# Patient Record
Sex: Male | Born: 1960
Health system: Southern US, Community
[De-identification: ages and names within clinical notes are randomized; demographics above are authoritative.]

## PROBLEM LIST (undated history)

## (undated) DIAGNOSIS — I480 Paroxysmal atrial fibrillation: Secondary | ICD-10-CM

## (undated) DIAGNOSIS — R7303 Prediabetes: Secondary | ICD-10-CM

## (undated) DIAGNOSIS — R072 Precordial pain: Secondary | ICD-10-CM

## (undated) DIAGNOSIS — L02416 Cutaneous abscess of left lower limb: Secondary | ICD-10-CM

## (undated) DIAGNOSIS — R931 Abnormal findings on diagnostic imaging of heart and coronary circulation: Secondary | ICD-10-CM

## (undated) DIAGNOSIS — Z87891 Personal history of nicotine dependence: Secondary | ICD-10-CM

## (undated) HISTORY — DX: Paroxysmal atrial fibrillation: I48.0

## (undated) HISTORY — DX: Personal history of nicotine dependence: Z87.891

## (undated) HISTORY — DX: Prediabetes: R73.03

## (undated) HISTORY — PX: MULTIPLE TOOTH EXTRACTIONS: SHX2053

## (undated) HISTORY — DX: Precordial pain: R07.2

## (undated) HISTORY — DX: Abnormal findings on diagnostic imaging of heart and coronary circulation: R93.1

## (undated) HISTORY — DX: Morbid (severe) obesity due to excess calories: E66.01

---
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
## 1999-01-10 ENCOUNTER — Emergency Department (HOSPITAL_COMMUNITY): Admission: EM | Admit: 1999-01-10 | Discharge: 1999-01-10 | Payer: Self-pay | Admitting: Emergency Medicine

## 1999-01-16 ENCOUNTER — Emergency Department (HOSPITAL_COMMUNITY): Admission: EM | Admit: 1999-01-16 | Discharge: 1999-01-16 | Payer: Self-pay | Admitting: Emergency Medicine

## 2002-03-06 ENCOUNTER — Emergency Department (HOSPITAL_COMMUNITY): Admission: EM | Admit: 2002-03-06 | Discharge: 2002-03-06 | Payer: Self-pay | Admitting: Emergency Medicine

## 2002-03-06 ENCOUNTER — Encounter: Payer: Self-pay | Admitting: Emergency Medicine

## 2003-08-08 LAB — COMPREHENSIVE METABOLIC PANEL
ALT: 18 U/L (ref 10–40)
AST: 20 U/L
Creat: 1.1
Total Bilirubin: 0.9 mg/dL

## 2003-08-08 LAB — CBC: Hemoglobin: 14.6 g/dL (ref 13.5–17.5)

## 2003-08-08 LAB — LIPID PANEL: Cholesterol, Total: 175

## 2006-12-05 HISTORY — PX: OTHER SURGICAL HISTORY: SHX169

## 2006-12-18 ENCOUNTER — Emergency Department (HOSPITAL_COMMUNITY): Admission: EM | Admit: 2006-12-18 | Discharge: 2006-12-18 | Payer: Self-pay | Admitting: Family Medicine

## 2009-05-05 ENCOUNTER — Ambulatory Visit: Payer: Self-pay | Admitting: Internal Medicine

## 2010-11-05 ENCOUNTER — Ambulatory Visit: Payer: Self-pay | Admitting: Internal Medicine

## 2011-01-05 HISTORY — PX: DOBUTAMINE STRESS ECHO: SHX5426

## 2011-05-26 ENCOUNTER — Ambulatory Visit: Payer: Self-pay | Admitting: Family Medicine

## 2011-06-06 ENCOUNTER — Encounter: Payer: Self-pay | Admitting: Family Medicine

## 2011-06-06 ENCOUNTER — Ambulatory Visit (INDEPENDENT_AMBULATORY_CARE_PROVIDER_SITE_OTHER): Payer: 59 | Admitting: Family Medicine

## 2011-06-06 VITALS — BP 142/82 | HR 68 | Temp 98.1°F | Ht 73.0 in | Wt 288.0 lb

## 2011-06-06 DIAGNOSIS — Z6841 Body Mass Index (BMI) 40.0 and over, adult: Secondary | ICD-10-CM | POA: Insufficient documentation

## 2011-06-06 DIAGNOSIS — E669 Obesity, unspecified: Secondary | ICD-10-CM

## 2011-06-06 DIAGNOSIS — Z0000 Encounter for general adult medical examination without abnormal findings: Secondary | ICD-10-CM | POA: Insufficient documentation

## 2011-06-06 DIAGNOSIS — Z720 Tobacco use: Secondary | ICD-10-CM | POA: Insufficient documentation

## 2011-06-06 DIAGNOSIS — F17200 Nicotine dependence, unspecified, uncomplicated: Secondary | ICD-10-CM

## 2011-06-06 NOTE — Progress Notes (Signed)
Subjective:    Patient ID: Eric Salazar, male    DOB: [birth year], 50 y.o.   MRN: [id number]  HPI CC: new patient, establish  Would like to establish here as more convenient as lives in [place].  Previously saw Dr. Eden Emms Baxley in [place], overall routine care.   Has had back spasms in past, back "going out".  Has had L knee pain in past.  No questions or concerns today.  Smoker - trying to quit again.  Quit cold [place] 2 mo ago, restarted about 2 wks ago.  Has never tried chantix.  Has tried e-cigarettes, helped some.  A few months ago had chest pain, SOB.  Saw cards at [facility], stress test looking normal.  Told could do catheterization, thinking about it.  Preventative: Last CPE 93yrs ago with blood work. Last tetanus shot, unsure, thinks received at previous PCP's.  Medications and allergies reviewed and updated in chart.  There is no problem list on file for this patient.  Past Medical History  Diagnosis Date  . Smoker    No past surgical history on file. History  Substance Use Topics  . Smoking status: Current Everyday Smoker -- 0.5 packs/day    Types: Cigarettes  . Smokeless tobacco: Never Used  . Alcohol Use: Yes     occasionally   Family History  Problem Relation Age of Onset  . Hypertension Mother   . Diabetes Mother     prediabetes  . Coronary artery disease Mother 55  . Hypertension Father   . Stroke Father 63  . Cancer Neg Hx    No Known Allergies No current outpatient prescriptions on file prior to visit.   Review of Systems  Constitutional: Negative for fever, chills, activity change, appetite change, fatigue and unexpected weight change.  HENT: Negative for hearing loss and neck pain.   Eyes: Negative for visual disturbance.  Respiratory: Negative for cough, chest tightness, shortness of breath and wheezing.   Cardiovascular: Negative for chest pain, palpitations and leg swelling.  Gastrointestinal: Negative for nausea, vomiting,  abdominal pain, diarrhea, constipation, blood in stool and abdominal distention.  Genitourinary: Negative for hematuria and difficulty urinating.  Musculoskeletal: Negative for myalgias and arthralgias.  Skin: Negative for rash.  Neurological: Negative for dizziness, seizures, syncope and headaches.  Hematological: Does not bruise/bleed easily.  Psychiatric/Behavioral: Negative for dysphoric mood. The patient is not nervous/anxious.        Objective:   Physical Exam  Nursing note and vitals reviewed. Constitutional: He is oriented to person, place, and time. He appears well-developed and well-nourished. No distress.  HENT:  Head: Normocephalic and atraumatic.  Right Ear: External ear normal.  Left Ear: External ear normal.  Nose: Nose normal.  Mouth/Throat: Oropharynx is clear and moist.  Eyes: Conjunctivae and EOM are normal. Pupils are equal, round, and reactive to light.  Neck: Normal range of motion. Neck supple. No thyromegaly present.  Cardiovascular: Normal rate, regular rhythm, normal heart sounds and intact distal pulses.   No murmur heard. Pulses:      Radial pulses are 2+ on the right side, and 2+ on the left side.  Pulmonary/Chest: Effort normal and breath sounds normal. No respiratory distress. He has no wheezes. He has no rales.       Somewhat coarse breath sounds  Abdominal: Soft. Bowel sounds are normal. He exhibits no distension and no mass. There is no tenderness. There is no rebound and no guarding.  Musculoskeletal: Normal range of motion.  Lymphadenopathy:  He has no cervical adenopathy.  Neurological: He is alert and oriented to person, place, and time.       CN grossly intact, station and gait intact  Skin: Skin is warm and dry. No rash noted.  Psychiatric: He has a normal mood and affect. His behavior is normal. Judgment and thought content normal.          Assessment & Plan:

## 2011-06-06 NOTE — Assessment & Plan Note (Addendum)
Recommend incorporate activity into routine. Body mass index is 38.00 kg/(m^2).

## 2011-06-06 NOTE — Assessment & Plan Note (Signed)
Discussed importance of cessation, encouraged to continue cutting back. Advised to let us know if wants assistance, pt prefers to try cold [place] as previously.

## 2011-06-06 NOTE — Assessment & Plan Note (Addendum)
No fmhx colon CA, not due for this yet. Recommend return after age 50yo for CPE.  Discussed things to watch for including blood in stool, BM changes to return prior.  Will draw blood work then. Did not request records from prior PCP as pt states has only had routine care there.

## 2011-06-06 NOTE — Patient Instructions (Signed)
Keep working on quitting smoking - best thing for your health. Consider increasing activity - making it a part of your routine. Return at end of year or early next year for physical, return prior or day of fasting for blood work. Good to meet you today, call us with questions.

## 2011-07-05 ENCOUNTER — Other Ambulatory Visit: Payer: Self-pay | Admitting: Family Medicine

## 2011-07-05 DIAGNOSIS — Z0000 Encounter for general adult medical examination without abnormal findings: Secondary | ICD-10-CM

## 2011-07-05 DIAGNOSIS — E669 Obesity, unspecified: Secondary | ICD-10-CM

## 2011-07-11 ENCOUNTER — Other Ambulatory Visit (INDEPENDENT_AMBULATORY_CARE_PROVIDER_SITE_OTHER): Payer: 59 | Admitting: Family Medicine

## 2011-07-11 DIAGNOSIS — Z0000 Encounter for general adult medical examination without abnormal findings: Secondary | ICD-10-CM

## 2011-07-11 DIAGNOSIS — E669 Obesity, unspecified: Secondary | ICD-10-CM

## 2011-07-11 LAB — LIPID PANEL
Cholesterol: 175 mg/dL (ref 0–200)
HDL: 44.7 mg/dL (ref >39.00)
LDL Cholesterol: 110 mg/dL — ABNORMAL HIGH (ref 0–99)
Total CHOL/HDL Ratio: 4
Triglycerides: 101 mg/dL (ref 0.0–149.0)
VLDL: 20.2 mg/dL (ref 0.0–40.0)

## 2011-07-11 LAB — COMPREHENSIVE METABOLIC PANEL
ALT: 30 U/L (ref 0–53)
AST: 27 U/L (ref 0–37)
Albumin: 4.1 g/dL (ref 3.5–5.2)
Alkaline Phosphatase: 87 U/L (ref 39–117)
BUN: 16 mg/dL (ref 6–23)
CO2: 28 mEq/L (ref 19–32)
Calcium: 8.9 mg/dL (ref 8.4–10.5)
Chloride: 97 mEq/L (ref 96–112)
Creatinine, Ser: 1.2 mg/dL (ref 0.4–1.5)
GFR: 69.53 mL/min (ref >60.00)
Glucose, Bld: 126 mg/dL — ABNORMAL HIGH (ref 70–99)
Potassium: 3.8 mEq/L (ref 3.5–5.1)
Sodium: 136 mEq/L (ref 135–145)
Total Bilirubin: 1.3 mg/dL — ABNORMAL HIGH (ref 0.3–1.2)
Total Protein: 7.1 g/dL (ref 6.0–8.3)

## 2011-07-11 LAB — TSH: TSH: 1.19 u[IU]/mL (ref 0.35–5.50)

## 2011-07-15 ENCOUNTER — Encounter: Payer: Self-pay | Admitting: Family Medicine

## 2011-07-15 ENCOUNTER — Ambulatory Visit (INDEPENDENT_AMBULATORY_CARE_PROVIDER_SITE_OTHER): Payer: 59 | Admitting: Family Medicine

## 2011-07-15 DIAGNOSIS — R7309 Other abnormal glucose: Secondary | ICD-10-CM

## 2011-07-15 DIAGNOSIS — F17200 Nicotine dependence, unspecified, uncomplicated: Secondary | ICD-10-CM

## 2011-07-15 DIAGNOSIS — R7303 Prediabetes: Secondary | ICD-10-CM | POA: Insufficient documentation

## 2011-07-15 DIAGNOSIS — Z0000 Encounter for general adult medical examination without abnormal findings: Secondary | ICD-10-CM

## 2011-07-15 DIAGNOSIS — Z0279 Encounter for issue of other medical certificate: Secondary | ICD-10-CM

## 2011-07-15 DIAGNOSIS — Z23 Encounter for immunization: Secondary | ICD-10-CM

## 2011-07-15 DIAGNOSIS — E669 Obesity, unspecified: Secondary | ICD-10-CM

## 2011-07-15 LAB — POCT URINALYSIS DIPSTICK
Bilirubin, UA: NEGATIVE
Blood, UA: NEGATIVE
Glucose, UA: NEGATIVE
Leukocytes, UA: NEGATIVE
Nitrite, UA: NEGATIVE
Urobilinogen, UA: 0.2

## 2011-07-15 NOTE — Assessment & Plan Note (Addendum)
Recheck 6 mo Meanwhile discussed healthy lifestyle changes to implement.

## 2011-07-15 NOTE — Progress Notes (Signed)
Subjective:    Patient ID: Eric Salazar, male    DOB: [birth year], 50 y.o.   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  Trying to become more physical - walking, jogging some if feeling up for it.  Changing eating habits, limiting caloric intake.  Smoking - 1/3 ppd, trying to cut down.  Wt Readings from Last 3 Encounters:  07/15/11 283 lb 12 oz (128.708 kg)  06/06/11 288 lb 0.6 oz (130.654 kg)   Preventative: Last CPE 2 yrs ago.  Received blood work recently. Last tetanus shot, unsure, thinks received at previous PCP's.  Would like today. No bm changes, blood in stool.  No nocturia.  Strong stream.  Medications and allergies reviewed and updated in chart. Patient Active Problem List  Diagnoses  . Smoker  . Obesity  . Healthcare maintenance   Past Medical History  Diagnosis Date  . Smoker    No past surgical history on file. History  Substance Use Topics  . Smoking status: Current Everyday Smoker -- 0.5 packs/day    Types: Cigarettes  . Smokeless tobacco: Never Used  . Alcohol Use: Yes     occasionally   Family History  Problem Relation Age of Onset  . Hypertension Mother   . Diabetes Mother     prediabetes  . Coronary artery disease Mother 44  . Hypertension Father   . Stroke Father 58  . Cancer Neg Hx    No Known Allergies Current Outpatient Prescriptions on File Prior to Visit  Medication Sig Dispense Refill  . acetaminophen (TYLENOL) 325 MG tablet Take 650 mg by mouth every 6 (six) hours as needed.        . cyclobenzaprine (FLEXERIL) 10 MG tablet Take 10 mg by mouth as needed.        Marland Kitchen ibuprofen (ADVIL,MOTRIN) 200 MG tablet Take 200 mg by mouth every 6 (six) hours as needed.        . Multiple Vitamin (MULTIVITAMIN) tablet Take 1 tablet by mouth daily.         Review of Systems  Constitutional: Negative for fever, chills, activity change, appetite change, fatigue and unexpected weight change.  HENT: Negative for  hearing loss and neck pain.   Eyes: Negative for visual disturbance.  Respiratory: Negative for cough, chest tightness, shortness of breath and wheezing.   Cardiovascular: Negative for chest pain, palpitations and leg swelling.  Gastrointestinal: Negative for nausea, vomiting, abdominal pain, diarrhea, constipation, blood in stool and abdominal distention.  Genitourinary: Negative for hematuria and difficulty urinating.  Musculoskeletal: Negative for myalgias and arthralgias.  Skin: Negative for rash.  Neurological: Negative for dizziness, seizures, syncope and headaches.  Hematological: Does not bruise/bleed easily.  Psychiatric/Behavioral: Negative for dysphoric mood. The patient is not nervous/anxious.        Objective:   Physical Exam  Nursing note and vitals reviewed. Constitutional: He is oriented to person, place, and time. He appears well-developed and well-nourished. No distress.  HENT:  Head: Normocephalic and atraumatic.  Right Ear: External ear normal.  Left Ear: External ear normal.  Nose: Nose normal.  Mouth/Throat: Oropharynx is clear and moist.  Eyes: Conjunctivae and EOM are normal. Pupils are equal, round, and reactive to light.  Neck: Normal range of motion. Neck supple. No thyromegaly present.  Cardiovascular: Normal rate, regular rhythm, normal heart sounds and intact distal pulses.   No murmur heard. Pulses:      Radial pulses are 2+  on the right side, and 2+ on the left side.  Pulmonary/Chest: Effort normal and breath sounds normal. No respiratory distress. He has no wheezes. He has no rales.  Abdominal: Soft. Bowel sounds are normal. He exhibits no distension and no mass. There is no tenderness. There is no rebound and no guarding.  Musculoskeletal: Normal range of motion.  Lymphadenopathy:    He has no cervical adenopathy.  Neurological: He is alert and oriented to person, place, and time.       CN grossly intact, station and gait intact  Skin: Skin is  warm and dry. No rash noted.  Psychiatric: He has a normal mood and affect. His behavior is normal. Judgment and thought content normal.          Assessment & Plan:

## 2011-07-15 NOTE — Assessment & Plan Note (Signed)
Encouraged continued smoking cessation efforts.

## 2011-07-15 NOTE — Assessment & Plan Note (Signed)
reviewed healthy diet and lifestyle. Tdap today. Not due for colon screening.  No fmhx colon/prostate. 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

## 2011-07-15 NOTE — Patient Instructions (Signed)
Good to see you today.  Call us with questions. Tdap today. DOT form filled out. Things looking good today. Return in 6 months for fasting blood work to recheck sugar level and test for diabetes, return afterwards for office visit, continue diet/exercise changes to help control this.

## 2011-07-15 NOTE — Assessment & Plan Note (Signed)
Encouraged on weight loss.  Continue lifestyle changes.

## 2011-07-21 ENCOUNTER — Ambulatory Visit: Payer: Self-pay | Admitting: Cardiology

## 2011-10-09 ENCOUNTER — Encounter: Payer: Self-pay | Admitting: Family Medicine

## 2011-10-11 ENCOUNTER — Encounter: Payer: Self-pay | Admitting: Family Medicine

## 2011-11-03 ENCOUNTER — Encounter: Payer: Self-pay | Admitting: Family Medicine

## 2011-11-03 ENCOUNTER — Ambulatory Visit (INDEPENDENT_AMBULATORY_CARE_PROVIDER_SITE_OTHER): Payer: 59 | Admitting: Family Medicine

## 2011-11-03 VITALS — BP 158/96 | HR 84 | Temp 100.2°F | Wt 283.8 lb

## 2011-11-03 DIAGNOSIS — R51 Headache: Secondary | ICD-10-CM

## 2011-11-03 DIAGNOSIS — J329 Chronic sinusitis, unspecified: Secondary | ICD-10-CM | POA: Insufficient documentation

## 2011-11-03 MED ORDER — AMOXICILLIN-POT CLAVULANATE 875-125 MG PO TABS: 1.0000 | ORAL_TABLET | Freq: Two times a day (BID) | ORAL | Status: AC

## 2011-11-03 MED ORDER — KETOROLAC TROMETHAMINE 60 MG/2ML IM SOLN
30.0000 mg | Freq: Once | INTRAMUSCULAR | Status: AC
  Administered 2011-11-03: 30 mg via INTRAMUSCULAR

## 2011-11-03 NOTE — Progress Notes (Signed)
  Subjective:    Patient ID: Eric Salazar, male    DOB: Nov 18, 1961, 50 y.o.   MRN: [id number]  HPI CC: sinusitis?  4d h/o cold sxs.  Bad ST, sinus congestion, pain behind eyes, body aches, fever to 100.6.  Mild prodcutive cough of brown sputum.  Some SOB.  Appetite down.  HA described as frontal sinus pressure.  Drinking plenty of fluids.  Trouble sleeping.  Has been using salt water gargles, alka seltzer plus, aspirin.  No abd pain, chest pain, n/v/d.  + son sick as well.  Planning on giving up cigarettes.  bp up but has not had h/o elevated in past, anticipate due to acute illness.  Review of Systems Per HPI    Objective:   Physical Exam  Nursing note and vitals reviewed. Constitutional: He appears well-developed and well-nourished. No distress.  HENT:  Head: Normocephalic and atraumatic.  Right Ear: Hearing, tympanic membrane, external ear and ear canal normal.  Left Ear: Hearing, tympanic membrane, external ear and ear canal normal.  Nose: Nose normal. No mucosal edema or rhinorrhea. Right sinus exhibits no maxillary sinus tenderness and no frontal sinus tenderness. Left sinus exhibits no maxillary sinus tenderness and no frontal sinus tenderness.  Mouth/Throat: Uvula is midline and mucous membranes are normal. Posterior oropharyngeal erythema present. No oropharyngeal exudate, posterior oropharyngeal edema or tonsillar abscesses.       PNdrainage  Eyes: Conjunctivae and EOM are normal. Pupils are equal, round, and reactive to light. No scleral icterus.  Neck: Normal range of motion. Neck supple. No thyromegaly present.  Cardiovascular: Normal rate, regular rhythm, normal heart sounds and intact distal pulses.   No murmur heard. Pulmonary/Chest: Effort normal and breath sounds normal. No respiratory distress. He has no wheezes. He has no rales.  Lymphadenopathy:    He has no cervical adenopathy.  Skin: Skin is warm and dry. No rash noted.  Psychiatric: He has a normal  mood and affect.      Assessment & Plan:

## 2011-11-03 NOTE — Assessment & Plan Note (Signed)
Likely viral as early on. suportive care. Shot of toradol IM for sinus pressure HA today. Ibuprofen starting tomorrow, tylenol in interim. If not improving, fill abx. mucinex.

## 2011-11-03 NOTE — Progress Notes (Signed)
Addended by: Josph Macho A on: 11/03/2011 03:45 PM   Modules accepted: Orders

## 2011-11-03 NOTE — Patient Instructions (Addendum)
Shot of toradol IM today (30mg ) for headache. You have sinusitis, likely viral as early on. Push fluids and rest. Ibuprofen 600mg  three times a day for next several days. May do mucinex with fluid to mobilize mucous out. Nasal saline irrigation throughout day I would give this more time.  Viral infections usually last 7-10 days and then run their course. If symptoms worsening or fever >101.5 or worsening productive cough, fill antibiotic.

## 2011-11-15 ENCOUNTER — Other Ambulatory Visit: Payer: Self-pay | Admitting: *Deleted

## 2011-11-15 MED ORDER — CYCLOBENZAPRINE HCL 10 MG PO TABS: 10.0000 mg | ORAL_TABLET | ORAL | Status: DC | PRN

## 2011-11-15 NOTE — Telephone Encounter (Signed)
Forgot to ask for refill last week when seen.

## 2011-11-15 NOTE — Telephone Encounter (Signed)
Patient notified that rx was sent to pharmacy  ?

## 2011-11-15 NOTE — Telephone Encounter (Signed)
Sent in

## 2011-12-12 ENCOUNTER — Encounter: Payer: Self-pay | Admitting: Family Medicine

## 2011-12-12 ENCOUNTER — Ambulatory Visit (INDEPENDENT_AMBULATORY_CARE_PROVIDER_SITE_OTHER): Payer: 59 | Admitting: Family Medicine

## 2011-12-12 VITALS — BP 132/92 | HR 80 | Temp 97.5°F | Wt 283.8 lb

## 2011-12-12 DIAGNOSIS — M79609 Pain in unspecified limb: Secondary | ICD-10-CM

## 2011-12-12 DIAGNOSIS — M79604 Pain in right leg: Secondary | ICD-10-CM | POA: Insufficient documentation

## 2011-12-12 DIAGNOSIS — M79606 Pain in leg, unspecified: Secondary | ICD-10-CM | POA: Insufficient documentation

## 2011-12-12 MED ORDER — PREDNISONE 20 MG PO TABS: ORAL_TABLET | ORAL | Status: DC

## 2011-12-12 NOTE — Progress Notes (Signed)
  Subjective:    Patient ID: Eric Salazar, male    DOB: 1961-07-28, 51 y.o.   MRN: [id number]  HPI CC: back pain  Intermittent "back goes out on me".  Occasionally happens when twists certain way or lifts something heavier than he should.  Several weeks ago lifted something heavy, may have started after this.  Bilateral leg pain L>R radiates to knee.  Pain described as dull constant.  Mainly anterior thigh pain.  Worse with standing.  Walking worsens pain.  Sitting improves pain.  Last week started seeing chiropractor, didn't help.  States xray at chiropractor showing smaller disc bulge.  Wonders if sciatic nerve is pinched.  No back pain.  No fevers/chills, bowel/bladder accidents, numbness or weakness of leg.  H/o similar problem lower back, prior prescribed steroid pack which helped.  Wants to avoid surgery.  Has tried alleve, ice, flexeril, tylenol which haven't really helped.  Wt Readings from Last 3 Encounters:  12/12/11 283 lb 12 oz (128.708 kg)  11/03/11 283 lb 12 oz (128.708 kg)  07/15/11 283 lb 12 oz (128.708 kg)   Review of Systems Per HPI    Objective:   Physical Exam  Nursing note and vitals reviewed. Constitutional: He appears well-developed and well-nourished. No distress.  HENT:  Head: Normocephalic and atraumatic.  Musculoskeletal:       No midline spine tenderness, no paraspinous mm tenderness. No pain at GTB, SIJ or sciatic notch bilaterally. No scoliosis. Neg SLR bilaterally, neg FABER, no pain with int/ext rotation at hip.  Neurological: He has normal strength. No sensory deficit. He displays a negative Romberg sign.       Decreased reflexes bilateral LE  Skin: Skin is warm and dry. No rash noted.  Psychiatric: He has a normal mood and affect.      Assessment & Plan:

## 2011-12-12 NOTE — Assessment & Plan Note (Addendum)
Anticipate coming from lumbar spine leading to some left cervical radiculopathy.  ?foraminal DDD.  No neurological involvement.  Will treat with prednisone course and send pt home with back and core strengthening exercises. May continue to use flexeril and ibuprofen. Prescribed steroid course today. Update [place] if not improving as expected for PT.

## 2011-12-12 NOTE — Patient Instructions (Signed)
I think your leg pain is coming from lower back. Treat with steroid course as well as may continue flexeril and acetaminophen (tylenol).  Also do exercises provided today once feeling better from pain. Use ibuprofen after done with steroids. Let me know if not improving for referral to physical therapy. If worsening, or fevers, or bowel/bladder accidents, please return right away.

## 2011-12-29 ENCOUNTER — Encounter: Payer: Self-pay | Admitting: Family Medicine

## 2011-12-29 ENCOUNTER — Ambulatory Visit (INDEPENDENT_AMBULATORY_CARE_PROVIDER_SITE_OTHER): Payer: 59 | Admitting: Family Medicine

## 2011-12-29 VITALS — BP 140/90 | HR 92 | Temp 97.9°F | Wt 288.5 lb

## 2011-12-29 DIAGNOSIS — M79609 Pain in unspecified limb: Secondary | ICD-10-CM

## 2011-12-29 DIAGNOSIS — M79606 Pain in leg, unspecified: Secondary | ICD-10-CM

## 2011-12-29 NOTE — Patient Instructions (Addendum)
Continue ibuprofen , may use regularly for next several days. Pass by [facility]'s office for referral to PT. If not helping, may recommend referral to spine doctor for further discussion (possible spine injections).  Let me know.

## 2011-12-29 NOTE — Progress Notes (Signed)
  Subjective:    Patient ID: Eric Salazar, male    DOB: 09-04-1961, 51 y.o.   MRN: [id number]  HPI CC: f/u leg pain  L>R pain going on about 1 mo now.  Left leg pain feels like "pulled muscle, tight" and some tingling down legs worse with prolonged walking.  Leg discomfort anterior thighs from hip to knee.  Intermittent back pain, less frequent.  Thought sciatica, treated with prednisone dose pack, didn't notice improvement with this.  Also placed on flexeril.  Ibuprofen does help.  Takes intermittently, 400-600mg  at a time.  Pain worse with standing still than with movement.  Wants to avoid surgery.  Has tried alleve, ice, flexeril, tylenol which haven't really helped.  MRI 2008 showing L4/5 disc protrusion with impingement L5/L4.  No fevers/chills, leg weakness, bowel/bladder incontinence, evident trauma/accident to initiate sxs.  Past Medical History  Diagnosis Date  . Smoker   . Obesity   . Prediabetes    Past Surgical History  Procedure Date  . Mri lumbar 2008    disc protrusion L4/5 with impingement L5 and probably L4 nerve roots  . Dobutamine stress echo 01/2011    for typical/atypical chest pain - good tolerance, 11 METS, no significant abnormalities     Review of Systems per HPI   Objective:   Physical Exam  Nursing note and vitals reviewed. Constitutional: He appears well-developed and well-nourished. No distress.  Musculoskeletal: Normal range of motion. He exhibits no edema.       No midline spine tenderness, no paraspinous mm tenderness. No pain at SIJ, GTB, sciatic notch Neg SLR bilaterally  Neurological: He is alert. He has normal strength and normal reflexes. No sensory deficit. He exhibits normal muscle tone.       Diminished DTRs BLE       Assessment & Plan:

## 2011-12-29 NOTE — Assessment & Plan Note (Signed)
L>R paresthesias and mild "tightness" anterior thigh in known HNP from MRI 2008. anticipate flare of same. Refer to PT for further eval/treatment. If not improving, obtain MRI vs refer to spine for consideration of ESI. Pt wants to avoid surgery. Ibuprofen regularly for 5 days then prn.

## 2012-01-07 ENCOUNTER — Other Ambulatory Visit: Payer: Self-pay | Admitting: Family Medicine

## 2014-11-07 ENCOUNTER — Encounter: Payer: Self-pay | Admitting: Family Medicine

## 2014-11-07 ENCOUNTER — Ambulatory Visit (INDEPENDENT_AMBULATORY_CARE_PROVIDER_SITE_OTHER): Payer: BC Managed Care – PPO | Admitting: Family Medicine

## 2014-11-07 VITALS — BP 138/96 | HR 76 | Temp 98.3°F | Wt 306.5 lb

## 2014-11-07 DIAGNOSIS — H019 Unspecified inflammation of eyelid: Secondary | ICD-10-CM | POA: Insufficient documentation

## 2014-11-07 DIAGNOSIS — R030 Elevated blood-pressure reading, without diagnosis of hypertension: Secondary | ICD-10-CM

## 2014-11-07 DIAGNOSIS — I10 Essential (primary) hypertension: Secondary | ICD-10-CM | POA: Insufficient documentation

## 2014-11-07 DIAGNOSIS — H01136 Eczematous dermatitis of left eye, unspecified eyelid: Secondary | ICD-10-CM

## 2014-11-07 NOTE — Assessment & Plan Note (Signed)
Reviewed elevated diastolic today.  Recheck at CPE - pt will schedule in next 1-2 months.

## 2014-11-07 NOTE — Progress Notes (Signed)
Pre visit review using our clinic review tool, if applicable. No additional management support is needed unless otherwise documented below in the visit note. 

## 2014-11-07 NOTE — Progress Notes (Signed)
   BP 138/96 mmHg  Pulse 76  Temp(Src) 98.3 F (36.8 C) (Oral)  Wt 306 lb 8 oz (139.027 kg)   CC: eyelid dryness Subjective:    Patient ID: Eric Salazar, male    DOB: 1961/08/03, 53 y.o.   MRN: [id number]  HPI: Eric FeeMichael W Alden is a 53 y.o. male presenting on 11/07/2014 for Eye Pain   L eyelid dryness - actually improved. May have started after rubbing L eye at work ?chemical exposure. Ongoing for a few weeks, noted L dry upper eyelid external skin. Never scratching of eyeball itself. Mildly pruritic eyelid. No pain.  No matting of eye or crusting or discharge.  No h/o eczema.  Denies vision changes, fever/chills, pain with eye movements. URI sxs a few weeks ago. Recently bought readers.  18 lb weight gain noted over last 3 years. Mildly elevated blood pressures today.  Declines flu shot today.  Relevant past medical, surgical, family and social history reviewed and updated as indicated. Interim medical history since our last visit reviewed. Allergies and medications reviewed and updated.  Current Outpatient Prescriptions on File Prior to Visit  Medication Sig  . acetaminophen (TYLENOL) 325 MG tablet Take 650 mg by mouth every 6 (six) hours as needed.    Marland Kitchen. ibuprofen (ADVIL,MOTRIN) 200 MG tablet Take 200 mg by mouth every 6 (six) hours as needed.    . Multiple Vitamin (MULTIVITAMIN) tablet Take 1 tablet by mouth daily.    . vitamin C (ASCORBIC ACID) 500 MG tablet Take 500 mg by mouth daily.     No current facility-administered medications on file prior to visit.    Review of Systems Per HPI unless specifically indicated above     Objective:    BP 138/96 mmHg  Pulse 76  Temp(Src) 98.3 F (36.8 C) (Oral)  Wt 306 lb 8 oz (139.027 kg)  Wt Readings from Last 3 Encounters:  11/07/14 306 lb 8 oz (139.027 kg)  12/29/11 288 lb 8 oz (130.863 kg)  12/12/11 283 lb 12 oz (128.708 kg)    Physical Exam  Constitutional: He appears well-developed and well-nourished. No  distress.  HENT:  Head: Atraumatic.  Mouth/Throat: Oropharynx is clear and moist. No oropharyngeal exudate.  Eyes: Conjunctivae and EOM are normal. Pupils are equal, round, and reactive to light. Right conjunctiva is not injected. Left conjunctiva is not injected. No scleral icterus.  Ever so slight erythema of upper L external eyelid without discharge. EOMI without pain PERRLA  Nursing note and vitals reviewed.      Assessment & Plan:   Problem List Items Addressed This Visit    Morbid obesity    Body mass index is 40.45 kg/(m^2).  18lb weight gain noted and discussed Pt states planning on starting walking again.    Elevated blood pressure reading    Reviewed elevated diastolic today.  Recheck at CPE - pt will schedule in next 1-2 months.     Dermatitis of eyelid - Primary    Now resolved. Anticipate irritant after exposure at work. Discussed use of warm compresses of vaseline if recurs. Pt agrees with plan.        Follow up plan: Return as needed, for annual exam, prior fasting for blood work.

## 2014-11-07 NOTE — Patient Instructions (Addendum)
Start checking blood pressures at home or local pharmacy and let me know if consistently >140/90. For eye - warm compresses if recurs. Looking ok today. Return as needed over next few months for physical (on a Thursday)

## 2014-11-07 NOTE — Assessment & Plan Note (Signed)
Body mass index is 40.45 kg/(m^2).  18lb weight gain noted and discussed Pt states planning on starting walking again.

## 2014-11-07 NOTE — Assessment & Plan Note (Signed)
Now resolved. Anticipate irritant after exposure at work. Discussed use of warm compresses of vaseline if recurs. Pt agrees with plan.

## 2014-11-10 ENCOUNTER — Telehealth: Payer: Self-pay | Admitting: Family Medicine

## 2014-11-10 NOTE — Telephone Encounter (Signed)
emmi mailed  °

## 2014-11-18 ENCOUNTER — Ambulatory Visit (INDEPENDENT_AMBULATORY_CARE_PROVIDER_SITE_OTHER): Payer: BC Managed Care – PPO | Admitting: Family Medicine

## 2014-11-18 ENCOUNTER — Encounter: Payer: Self-pay | Admitting: Family Medicine

## 2014-11-18 VITALS — BP 130/80 | HR 68 | Temp 97.7°F | Ht 70.5 in | Wt 298.0 lb

## 2014-11-18 DIAGNOSIS — F17200 Nicotine dependence, unspecified, uncomplicated: Secondary | ICD-10-CM

## 2014-11-18 DIAGNOSIS — Z1211 Encounter for screening for malignant neoplasm of colon: Secondary | ICD-10-CM

## 2014-11-18 DIAGNOSIS — Z0000 Encounter for general adult medical examination without abnormal findings: Secondary | ICD-10-CM

## 2014-11-18 DIAGNOSIS — R030 Elevated blood-pressure reading, without diagnosis of hypertension: Secondary | ICD-10-CM

## 2014-11-18 DIAGNOSIS — R7303 Prediabetes: Secondary | ICD-10-CM

## 2014-11-18 DIAGNOSIS — Z720 Tobacco use: Secondary | ICD-10-CM

## 2014-11-18 NOTE — Progress Notes (Signed)
BP 130/80 mmHg  Pulse 68  Temp(Src) 97.7 F (36.5 C) (Tympanic)  Ht 5' 10.5" (1.791 m)  Wt 298 lb (135.172 kg)  BMI 42.14 kg/m2  SpO2 98%   CC: CPE  Subjective:    Patient ID: Eric Salazar, male    DOB: Jan 19, 1961, 53 y.o.   MRN: [id number]  HPI: Eric Salazar is a 53 y.o. male presenting on 11/18/2014 for Annual Exam   Had DOT earlier this year.  Bought cuff - bp at home running 130/80s.  Backing off beer, increasing walking, noted 8 lb weight loss in last 10 days.  Smoking - current intermittent smoker - 1-15 cig. Tried e cig in past, didn't like taste.   Preventative: Colon cancer screening - discussed. Requests iFOB.  Prostate cancer screening - discussed. Nocturia x1, strong stream. Decides to defer screening for now but may consider in future. 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 Interim medical history since our last visit reviewed. Allergies and medications reviewed and updated.  Current Outpatient Prescriptions on File Prior to Visit  Medication Sig  . acetaminophen (TYLENOL) 325 MG tablet Take 650 mg by mouth every 6 (six) hours as needed.    Marland Kitchen. ibuprofen (ADVIL,MOTRIN) 200 MG tablet Take 200 mg by mouth every 6 (six) hours as needed.    . Multiple Vitamin (MULTIVITAMIN) tablet Take 1 tablet by mouth daily.    . vitamin C (ASCORBIC ACID) 500 MG tablet Take 500 mg by mouth daily.     No current facility-administered medications on file prior to visit.    Review of Systems  Constitutional: Negative for fever, chills, activity change, appetite change, fatigue and unexpected weight change.  HENT: Positive for sinus pressure (intermittent).  Negative for hearing loss.   Eyes: Negative for visual disturbance.  Respiratory: Negative for cough, chest tightness, shortness of breath and wheezing.   Cardiovascular: Negative for chest pain, palpitations and leg swelling.  Gastrointestinal: Negative for nausea, vomiting, abdominal pain, diarrhea, constipation, blood in stool and abdominal distention.  Genitourinary: Negative for hematuria and difficulty urinating.  Musculoskeletal: Negative for myalgias, arthralgias and neck pain.  Skin: Negative for rash.  Neurological: Negative for dizziness, seizures, syncope and headaches.  Hematological: Negative for adenopathy. Does not bruise/bleed easily.  Psychiatric/Behavioral: Negative for dysphoric mood. The patient is not nervous/anxious.    Per HPI unless specifically indicated above     Objective:    BP 130/80 mmHg  Pulse 68  Temp(Src) 97.7 F (36.5 C) (Tympanic)  Ht 5' 10.5" (1.791 m)  Wt 298 lb (135.172 kg)  BMI 42.14 kg/m2  SpO2 98%  Wt Readings from Last 3 Encounters:  11/18/14 298 lb (135.172 kg)  11/07/14 306 lb 8 oz (139.027 kg)  12/29/11 288 lb 8 oz (130.863 kg)    Physical Exam  Constitutional: He is oriented to person, place, and time. He appears well-developed and well-nourished. No distress.  Morbidly obese  HENT:  Head: Normocephalic and atraumatic.  Right Ear: Hearing, tympanic membrane, external ear and ear canal normal.  Left Ear: Hearing, tympanic membrane, external ear and ear canal normal.  Nose: Nose normal.  Mouth/Throat: Uvula is midline, oropharynx is clear and moist and mucous membranes are  normal. No oropharyngeal exudate, posterior oropharyngeal edema or posterior oropharyngeal erythema.  Eyes: Conjunctivae and EOM are normal. Pupils are equal, round, and reactive to light. No scleral icterus.  Neck: Normal range of motion. Neck supple. No thyromegaly present.  Cardiovascular: Normal rate, regular rhythm, normal heart sounds and intact distal  pulses.   No murmur heard. Pulses:      Radial pulses are 2+ on the right side, and 2+ on the left side.  Pulmonary/Chest: Effort normal and breath sounds normal. No respiratory distress. He has no wheezes. He has no rales.  Abdominal: Soft. Bowel sounds are normal. He exhibits no distension and no mass. There is no tenderness. There is no rebound and no guarding.  Genitourinary:  Pt declines prostate exam  Musculoskeletal: Normal range of motion. He exhibits no edema.  Lymphadenopathy:    He has no cervical adenopathy.  Neurological: He is alert and oriented to person, place, and time.  CN grossly intact, station and gait intact  Skin: Skin is warm and dry. No rash noted.  Psychiatric: He has a normal mood and affect. His behavior is normal. Judgment and thought content normal.  Nursing note and vitals reviewed.     Assessment & Plan:   Problem List Items Addressed This Visit    Smoker    Discussed different forms of NRT and recommended nicorette gum for his situation, discussed park and chew method Provided with quitline [employer] website for further resources    Prediabetes    Check random cbg. No results found for: HGBA1C     Morbid obesity    Body mass index is 42.14 kg/(m^2).     Relevant Orders      Lipid panel      Basic metabolic panel   Healthcare maintenance - Primary    Preventative protocols reviewed and updated unless pt declined. Discussed healthy diet and lifestyle.  Declines flu shot    Elevated blood pressure reading    bp stable recently - bought cuff at home.     Other Visit Diagnoses    Colon cancer screening        Relevant Orders       Fecal occult blood, imunochemical        Follow up plan: Return in about 1 year (around 11/19/2015), or as needed, for annual exam, prior fasting for blood work.

## 2014-11-18 NOTE — Assessment & Plan Note (Signed)
Body mass index is 42.14 kg/(m^2).

## 2014-11-18 NOTE — Patient Instructions (Addendum)
Look into [email or web address] Try nicorette gum park and chew method. Pass by lab to pick up stool kit. labwork today Good to see you today, call us with questions.

## 2014-11-18 NOTE — Assessment & Plan Note (Signed)
Check random cbg. No results found for: HGBA1C

## 2014-11-18 NOTE — Assessment & Plan Note (Signed)
Discussed different forms of NRT and recommended nicorette gum for his situation, discussed park and chew method Provided with quitline [place] website for further resources

## 2014-11-18 NOTE — Assessment & Plan Note (Signed)
Preventative protocols reviewed and updated unless pt declined. Discussed healthy diet and lifestyle.  Declines flu shot. 

## 2014-11-18 NOTE — Assessment & Plan Note (Signed)
bp stable recently - bought cuff at home.

## 2014-11-18 NOTE — Progress Notes (Signed)
Pre visit review using our clinic review tool, if applicable. No additional management support is needed unless otherwise documented below in the visit note. 

## 2014-11-19 LAB — BASIC METABOLIC PANEL
BUN: 15 mg/dL (ref 6–23)
CHLORIDE: 103 meq/L (ref 96–112)
CO2: 28 meq/L (ref 19–32)
Calcium: 9.1 mg/dL (ref 8.4–10.5)
Creatinine, Ser: 1.2 mg/dL (ref 0.4–1.5)
GFR: 69.98 mL/min (ref >60.00)
GLUCOSE: 87 mg/dL (ref 70–99)
POTASSIUM: 4.1 meq/L (ref 3.5–5.1)
Sodium: 138 mEq/L (ref 135–145)

## 2014-11-19 LAB — LIPID PANEL
CHOLESTEROL: 179 mg/dL (ref 0–200)
HDL: 38.2 mg/dL — AB (ref >39.00)
LDL Cholesterol: 125 mg/dL — ABNORMAL HIGH (ref 0–99)
NonHDL: 140.8
TRIGLYCERIDES: 77 mg/dL (ref 0.0–149.0)
Total CHOL/HDL Ratio: 5
VLDL: 15.4 mg/dL (ref 0.0–40.0)

## 2014-11-20 ENCOUNTER — Encounter: Payer: Self-pay | Admitting: *Deleted

## 2015-06-15 ENCOUNTER — Other Ambulatory Visit: Payer: Self-pay

## 2015-06-15 ENCOUNTER — Emergency Department
Admission: EM | Admit: 2015-06-15 | Discharge: 2015-06-15 | Disposition: A | Discharge status: Home / Self Care | Payer: BLUE CROSS/BLUE SHIELD | Attending: Emergency Medicine | Admitting: Emergency Medicine

## 2015-06-15 ENCOUNTER — Telehealth: Payer: Self-pay | Admitting: Family Medicine

## 2015-06-15 ENCOUNTER — Emergency Department: Payer: BLUE CROSS/BLUE SHIELD

## 2015-06-15 ENCOUNTER — Encounter: Payer: Self-pay | Admitting: Emergency Medicine

## 2015-06-15 DIAGNOSIS — I10 Essential (primary) hypertension: Secondary | ICD-10-CM | POA: Diagnosis not present

## 2015-06-15 DIAGNOSIS — R0600 Dyspnea, unspecified: Secondary | ICD-10-CM

## 2015-06-15 DIAGNOSIS — Z720 Tobacco use: Secondary | ICD-10-CM | POA: Diagnosis not present

## 2015-06-15 DIAGNOSIS — R0602 Shortness of breath: Secondary | ICD-10-CM | POA: Diagnosis present

## 2015-06-15 LAB — BASIC METABOLIC PANEL
Anion gap: 7 (ref 5–15)
BUN: 17 mg/dL (ref 6–20)
CALCIUM: 9.2 mg/dL (ref 8.9–10.3)
CO2: 29 mmol/L (ref 22–32)
Chloride: 103 mmol/L (ref 101–111)
Creatinine, Ser: 1.16 mg/dL (ref 0.61–1.24)
GFR calc non Af Amer: >60 mL/min (ref >60)
GLUCOSE: 99 mg/dL (ref 65–99)
Potassium: 4.2 mmol/L (ref 3.5–5.1)
SODIUM: 139 mmol/L (ref 135–145)

## 2015-06-15 LAB — CBC WITH DIFFERENTIAL/PLATELET
Basophils Absolute: 0 10*3/uL (ref 0–0.1)
Basophils Relative: 0 %
EOS ABS: 0.3 10*3/uL (ref 0–0.7)
Eosinophils Relative: 5 %
HEMATOCRIT: 43.1 % (ref 40.0–52.0)
HEMOGLOBIN: 14.3 g/dL (ref 13.0–18.0)
LYMPHS PCT: 26 %
Lymphs Abs: 1.5 10*3/uL (ref 1.0–3.6)
MCH: 30.9 pg (ref 26.0–34.0)
MCHC: 33.2 g/dL (ref 32.0–36.0)
MCV: 93 fL (ref 80.0–100.0)
Monocytes Absolute: 0.7 10*3/uL (ref 0.2–1.0)
Monocytes Relative: 13 %
Neutro Abs: 3.3 10*3/uL (ref 1.4–6.5)
Neutrophils Relative %: 56 %
Platelets: 178 10*3/uL (ref 150–440)
RBC: 4.63 MIL/uL (ref 4.40–5.90)
RDW: 13.3 % (ref 11.5–14.5)
WBC: 5.8 10*3/uL (ref 3.8–10.6)

## 2015-06-15 LAB — BRAIN NATRIURETIC PEPTIDE: B NATRIURETIC PEPTIDE 5: 23 pg/mL (ref 0.0–100.0)

## 2015-06-15 LAB — TROPONIN I

## 2015-06-15 NOTE — ED Notes (Signed)
AAOx3.  Skin warm and dry. No SOB/ DOE.  D/C home. 

## 2015-06-15 NOTE — ED Notes (Addendum)
Exertional SOB X 10 days ago, increased BP that began yesterday. Denies taking medication for high BP. Reports 180 systolic. Marland Kitchen.Pt alert and oriented X4, active, cooperative, pt in NAD. RR even and unlabored, color WNL.  Sent by PCP to be evaluated.

## 2015-06-15 NOTE — ED Notes (Signed)
AAOx3.  Skin warm and dry.  Sitting in chair.  Denies complaint of pain.

## 2015-06-15 NOTE — Telephone Encounter (Signed)
[facility] - Client TELEPHONE ADVICE RECORD [facility] Patient Name: Eric Salazar DOB: 1961/11/07 Initial Comment Caller states he's been having shortness of breath- BP has been 182/100. Nurse Assessment Nurse: Lane HackerHarley, RN, Elvin SoWindy Date/Time (Eastern Time): 06/15/2015 8:23:57 AM Confirm and document reason for call. If symptomatic, describe symptoms. ---Caller states he's been having shortness of breath for the past week. BP at home has been 182/100 yesterday and rechecked and 173/102. States that he smokes and has been trying to quit the past 6 months. Has the patient traveled out of the country within the last 30 days? ---Not Applicable Does the patient require triage? ---Yes Related visit to physician within the last 2 weeks? ---No Does the PT have any chronic conditions? (i.e. 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 Final Disposition User Go to ED Now Lane HackerHarley, Charity fundraiserN, [place]

## 2015-06-15 NOTE — ED Notes (Signed)
Patient transported to X-ray 

## 2015-06-15 NOTE — ED Notes (Signed)
MD at bedside. 

## 2015-06-15 NOTE — Discharge Instructions (Signed)

## 2015-06-15 NOTE — ED Notes (Signed)
Patient states he noticed being short of breath and having higher than normal blood pressure x approx 10 days.

## 2015-06-15 NOTE — ED Provider Notes (Signed)
[facility] Emergency Department Provider Note     Time seen: ----------------------------------------- 9:32 AM on 06/15/2015 -----------------------------------------    I have reviewed the triage vital signs and the nursing notes.   HISTORY  Chief Complaint Hypertension and Shortness of Breath    HPI Eric Salazar is a 54 y.o. male who presents ER for intermittent shortness of breath for the last 10 days. Patient thinks this may be related to him trying to quit smoking. Patient does not describe specific shortness of breath with exertion, it is intermittent. Nothing seems to make it better or worse. Also was noted that he had high blood pressure at home. Denies fevers chills or other complaints.   Past Medical History  Diagnosis Date  . Smoker   . Obesity   . Prediabetes   . Hypertension     Patient Active Problem List   Diagnosis Date Noted  . Dermatitis of eyelid 11/07/2014  . Elevated blood pressure reading 11/07/2014  . Leg pain 12/12/2011  . Prediabetes   . Smoker 06/06/2011  . Morbid obesity 06/06/2011  . Healthcare maintenance 06/06/2011    Past Surgical History  Procedure Laterality Date  . Mri lumbar  2008    disc protrusion L4/5 with impingement L5 and probably L4 nerve roots  . Dobutamine stress echo  01/2011    for typical/atypical chest pain - good tolerance, 11 METS, no significant abnormalities    Allergies Review of patient's allergies indicates no known allergies.  Social History History  Substance Use Topics  . Smoking status: Current Every Day Smoker    Types: Cigarettes    Last Attempt to Quit: 10/29/2011  . Smokeless tobacco: Never Used     Comment: varies  . Alcohol Use: 0.0 oz/week    0 Standard drinks or equivalent per week     Comment: occasionally    Review of Systems Constitutional: Negative for fever. Eyes: Negative for visual changes. ENT: Negative for sore throat. Cardiovascular: Negative  for chest pain. Respiratory: Positive for intermittent shortness of breath Gastrointestinal: Negative for abdominal pain, vomiting and diarrhea. Genitourinary: Negative for dysuria. Musculoskeletal: Negative for back pain. Patient denies leg swelling or pain Skin: Negative for rash. Neurological: Negative for headaches, focal weakness or numbness.  10-point ROS otherwise negative.  ____________________________________________   PHYSICAL EXAM:  VITAL SIGNS: ED Triage Vitals  Enc Vitals Group     BP 06/15/15 0911 145/80 mmHg     Pulse Rate 06/15/15 0911 67     Resp 06/15/15 0911 20     Temp 06/15/15 0911 98.2 F (36.8 C)     Temp Source 06/15/15 0911 Oral     SpO2 06/15/15 0911 97 %     Weight 06/15/15 0911 300 lb (136.079 kg)     Height 06/15/15 0911 5\' 10"  (1.778 m)     Head Cir --      Peak Flow --      Pain Score --      Pain Loc --      Pain Edu? --      Excl. in GC? --     Constitutional: Alert and oriented. Well appearing and in no distress. Eyes: Conjunctivae are normal. PERRL. Normal extraocular movements. ENT   Head: Normocephalic and atraumatic.   Nose: No congestion/rhinnorhea.   Mouth/Throat: Mucous membranes are moist.   Neck: No stridor. Hematological/Lymphatic/Immunilogical: No cervical lymphadenopathy. Cardiovascular: Normal rate, regular rhythm. Normal and symmetric distal pulses are present in all extremities. No murmurs,  rubs, or gallops. Respiratory: Normal respiratory effort without tachypnea nor retractions. Breath sounds are clear and equal bilaterally. No wheezes/rales/rhonchi. Gastrointestinal: Soft and nontender. No distention. No abdominal bruits. There is no CVA tenderness. Musculoskeletal: Nontender with normal range of motion in all extremities. No joint effusions.  No lower extremity tenderness nor edema. Neurologic:  Normal speech and language. No gross focal neurologic deficits are appreciated. Speech is normal. No gait  instability. Skin:  Skin is warm, dry and intact. No rash noted. Psychiatric: Mood and affect are normal. Speech and behavior are normal. Patient exhibits appropriate insight and judgment. ____________________________________________  EKG: Interpreted by me. Normal sinus rhythm with a rate of 73, normal axis normal intervals. Minimal voltage criteria for LVH, likely normal. No evidence of acute infarction.  ____________________________________________  ED COURSE:  Pertinent labs & imaging results that were available during my care of the patient were reviewed by me and considered in my medical decision making (see chart for details). Patient received basic cardiac workup and chest x-ray. Unclear etiology for symptoms. ____________________________________________    LABS (pertinent positives/negatives)  Labs Reviewed  CBC WITH DIFFERENTIAL/PLATELET  BASIC METABOLIC PANEL  BRAIN NATRIURETIC PEPTIDE  TROPONIN I    RADIOLOGY  Chest x-ray is unremarkable  ____________________________________________  FINAL ASSESSMENT AND PLAN  Dyspnea  Plan: Patient with labs and x-rays as dictated above. All labs and x-rays unremarkable. He is stable for outpatient follow-up with his doctor. He advises baby aspirin daily and for him to return for worsening or worrisome symptoms. Heart score is low risk for major adverse cardiac event in the next 6 weeks.   Emily Filbert, MD   Emily Filbert, MD 06/15/15 [id number]

## 2015-06-15 NOTE — Telephone Encounter (Signed)
Noted. Currently at [facility] ER. Will follow along.

## 2015-11-20 ENCOUNTER — Encounter: Payer: Self-pay | Admitting: Family Medicine

## 2015-11-20 ENCOUNTER — Ambulatory Visit (INDEPENDENT_AMBULATORY_CARE_PROVIDER_SITE_OTHER): Payer: Managed Care, Other (non HMO) | Admitting: Family Medicine

## 2015-11-20 VITALS — BP 144/94 | HR 68 | Temp 98.3°F | Ht 70.5 in | Wt 304.0 lb

## 2015-11-20 DIAGNOSIS — Z125 Encounter for screening for malignant neoplasm of prostate: Secondary | ICD-10-CM

## 2015-11-20 DIAGNOSIS — Z0000 Encounter for general adult medical examination without abnormal findings: Secondary | ICD-10-CM | POA: Diagnosis not present

## 2015-11-20 DIAGNOSIS — Z87891 Personal history of nicotine dependence: Secondary | ICD-10-CM

## 2015-11-20 DIAGNOSIS — Z1211 Encounter for screening for malignant neoplasm of colon: Secondary | ICD-10-CM

## 2015-11-20 DIAGNOSIS — Z1159 Encounter for screening for other viral diseases: Secondary | ICD-10-CM

## 2015-11-20 DIAGNOSIS — Z114 Encounter for screening for human immunodeficiency virus [HIV]: Secondary | ICD-10-CM

## 2015-11-20 DIAGNOSIS — R7303 Prediabetes: Secondary | ICD-10-CM

## 2015-11-20 DIAGNOSIS — R030 Elevated blood-pressure reading, without diagnosis of hypertension: Secondary | ICD-10-CM

## 2015-11-20 NOTE — Progress Notes (Signed)
Pre visit review using our clinic review tool, if applicable. No additional management support is needed unless otherwise documented below in the visit note. 

## 2015-11-20 NOTE — Patient Instructions (Addendum)
Come in at your convenience over next few weeks on Wed or Thursday (our late days) for fasting labs (4-5 hours fast).  Restart walking regularly for activity.  Return in 2-3 weeks for fasting labs. Blood pressure is staying high. Check more frequently and bring me log in 2-3 weeks to review.  Consider OTC fungi-nail nail lacquer for right thumb nail.   Health Maintenance, Male A healthy lifestyle and preventative care can promote health and wellness.  Maintain regular health, dental, and eye exams.  Eat a healthy diet. Foods like vegetables, fruits, whole grains, low-fat dairy products, and lean protein foods contain the nutrients you need and are low in calories. Decrease your intake of foods high in solid fats, added sugars, and salt. Get information about a proper diet from your health care provider, if necessary.  Regular physical exercise is one of the most important things you can do for your health. Most adults should get at least 150 minutes of moderate-intensity exercise (any activity that increases your heart rate and causes you to sweat) each week. In addition, most adults need muscle-strengthening exercises on 2 or more days a week.   Maintain a healthy weight. The body mass index (BMI) is a screening tool to identify possible weight problems. It provides an estimate of body fat based on height and weight. Your health care provider can find your BMI and can help you achieve or maintain a healthy weight. For males 20 years and older:  A BMI below 18.5 is considered underweight.  A BMI of 18.5 to 24.9 is normal.  A BMI of 25 to 29.9 is considered overweight.  A BMI of 30 and above is considered obese.  Maintain normal blood lipids and cholesterol by exercising and minimizing your intake of saturated fat. Eat a balanced diet with plenty of fruits and vegetables. Blood tests for lipids and cholesterol should begin at age 61 and be repeated every 5 years. If your lipid or cholesterol  levels are high, you are over age 16, or you are at high risk for heart disease, you may need your cholesterol levels checked more frequently.Ongoing high lipid and cholesterol levels should be treated with medicines if diet and exercise are not working.  If you smoke, find out from your health care provider how to quit. If you do not use tobacco, do not start.  Lung cancer screening is recommended for adults aged 55-80 years who are at high risk for developing lung cancer because of a history of smoking. A yearly low-dose CT scan of the lungs is recommended for people who have at least a 30-pack-year history of smoking and are current smokers or have quit within the past 15 years. A pack year of smoking is smoking an average of 1 pack of cigarettes a day for 1 year (for example, a 30-pack-year history of smoking could mean smoking 1 pack a day for 30 years or 2 packs a day for 15 years). Yearly screening should continue until the smoker has stopped smoking for at least 15 years. Yearly screening should be stopped for people who develop a health problem that would prevent them from having lung cancer treatment.  If you choose to drink alcohol, do not have more than 2 drinks per day. One drink is considered to be 12 oz (360 mL) of beer, 5 oz (150 mL) of wine, or 1.5 oz (45 mL) of liquor.  Avoid the use of street drugs. Do not share needles with anyone. Ask for  help if you need support or instructions about stopping the use of drugs.  High blood pressure causes heart disease and increases the risk of stroke. High blood pressure is more likely to develop in:  People who have blood pressure in the end of the normal range (100-139/85-89 mm Hg).  People who are overweight or obese.  People who are African American.  If you are 7318-54 years of age, have your blood pressure checked every 3-5 years. If you are 54 years of age or older, have your blood pressure checked every year. You should have your blood  pressure measured twice--once when you are at a hospital or clinic, and once when you are not at a hospital or clinic. Record the average of the two measurements. To check your blood pressure when you are not at a hospital or clinic, you can use:  An automated blood pressure machine at a pharmacy.  A home blood pressure monitor.  If you are 7845-54 years old, ask your health care provider if you should take aspirin to prevent heart disease.  Diabetes screening involves taking a blood sample to check your fasting blood sugar level. This should be done once every 3 years after age 54 if you are at a normal weight and without risk factors for diabetes. Testing should be considered at a younger age or be carried out more frequently if you are overweight and have at least 1 risk factor for diabetes.  Colorectal cancer can be detected and often prevented. Most routine colorectal cancer screening begins at the age of 54 and continues through age 54. However, your health care provider may recommend screening at an earlier age if you have risk factors for colon cancer. On a yearly basis, your health care provider may provide home test kits to check for hidden blood in the stool. A small camera at the end of a tube may be used to directly examine the colon (sigmoidoscopy or colonoscopy) to detect the earliest forms of colorectal cancer. Talk to your health care provider about this at age 54 when routine screening begins. A direct exam of the colon should be repeated every 5-10 years through age 54, unless early forms of precancerous polyps or small growths are found.  People who are at an increased risk for hepatitis B should be screened for this virus. You are considered at high risk for hepatitis B if:  You were born in a country where hepatitis B occurs often. Talk with your health care provider about which countries are considered high risk.  Your parents were born in a high-risk country and you have not  received a shot to protect against hepatitis B (hepatitis B vaccine).  You have HIV or AIDS.  You use needles to inject street drugs.  You live with, or have sex with, someone who has hepatitis B.  You are a man who has sex with other men (MSM).  You get hemodialysis treatment.  You take certain medicines for conditions like cancer, organ transplantation, and autoimmune conditions.  Hepatitis C blood testing is recommended for all people born from [id number] through 1965 and any individual with known risk factors for hepatitis C.  Healthy men should no longer receive prostate-specific antigen (PSA) blood tests as part of routine cancer screening. Talk to your health care provider about prostate cancer screening.  Testicular cancer screening is not recommended for adolescents or adult males who have no symptoms. Screening includes self-exam, a health care provider exam, and other screening  tests. Consult with your health care provider about any symptoms you have or any concerns you have about testicular cancer.  Practice safe sex. Use condoms and avoid high-risk sexual practices to reduce the spread of sexually transmitted infections (STIs).  You should be screened for STIs, including gonorrhea and chlamydia if:  You are sexually active and are younger than 24 years.  You are older than 24 years, and your health care provider tells you that you are at risk for this type of infection.  Your sexual activity has changed since you were last screened, and you are at an increased risk for chlamydia or gonorrhea. Ask your health care provider if you are at risk.  If you are at risk of being infected with HIV, it is recommended that you take a prescription medicine daily to prevent HIV infection. This is called pre-exposure prophylaxis (PrEP). You are considered at risk if:  You are a man who has sex with other men (MSM).  You are a heterosexual man who is sexually active with multiple  partners.  You take drugs by injection.  You are sexually active with a partner who has HIV.  Talk with your health care provider about whether you are at high risk of being infected with HIV. If you choose to begin PrEP, you should first be tested for HIV. You should then be tested every 3 months for as long as you are taking PrEP.  Use sunscreen. Apply sunscreen liberally and repeatedly throughout the day. You should seek shade when your shadow is shorter than you. Protect yourself by wearing long sleeves, pants, a wide-brimmed hat, and sunglasses year round whenever you are outdoors.  Tell your health care provider of new moles or changes in moles, especially if there is a change in shape or color. Also, tell your health care provider if a mole is larger than the size of a pencil eraser.  A one-time screening for abdominal aortic aneurysm (AAA) and surgical repair of large AAAs by ultrasound is recommended for men aged 34-75 years who are current or former smokers.  Stay current with your vaccines (immunizations).   This information is not intended to replace advice given to you by your health care provider. Make sure you discuss any questions you have with your health care provider.   Document Released: 05/19/2008 Document Revised: 12/12/2014 Document Reviewed: 04/18/2011 Elsevier Interactive Patient Education [employer].

## 2015-11-20 NOTE — Assessment & Plan Note (Signed)
Discussed healthy diet and lifestyle changes to affect sustainable weight loss  

## 2015-11-20 NOTE — Progress Notes (Signed)
BP 144/94 mmHg  Pulse 68  Temp(Src) 98.3 F (36.8 C) (Oral)  Ht 5' 10.5" (1.791 m)  Wt 304 lb (137.893 kg)  BMI 42.99 kg/m2   CC: CPE  Subjective:    Patient ID: Eric Salazar, male    DOB: [birth year], 54 y.o.   MRN: [id number]  HPI: Eric Salazar is a 54 y.o. male presenting on 11/20/2015 for Annual Exam   BP running mildly elevated at home as well.   Ex smoking - quit several months ago. He did have ER visit for dyspnea. Using e-cig - nicotine free liquid.   Preventative: Colon cancer screening - discussed. Never submitted stool kit. Requests iFOB.  Prostate cancer screening - discussed. Nocturia x1, strong stream. Decides to defer screening for now but may consider in future.  Flu - declines Tdap 07/2011 Seat belt use discussed Sunscreen use discussed. 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 Edu: 2 years college Activity: no regular exercise Diet: good water, vegetables  Relevant past medical, surgical, family and social history reviewed and updated as indicated. Interim medical history since our last visit reviewed. Allergies and medications reviewed and updated. Current Outpatient Prescriptions on File Prior to Visit  Medication Sig  . Multiple Vitamin (MULTIVITAMIN) tablet Take 1 tablet by mouth daily.    . vitamin C (ASCORBIC ACID) 500 MG tablet Take 500 mg by mouth daily.     No current facility-administered medications on file prior to visit.    Review of Systems  Constitutional: Negative for fever, chills, activity change, appetite change, fatigue and unexpected weight change.  HENT: Negative for hearing loss.   Eyes: Negative for visual disturbance.  Respiratory: Negative for cough, chest tightness, shortness of breath and wheezing.   Cardiovascular: Negative for chest pain, palpitations and leg swelling.  Gastrointestinal: Negative for nausea, vomiting, abdominal pain, diarrhea,  constipation, blood in stool and abdominal distention.  Genitourinary: Negative for hematuria and difficulty urinating.  Musculoskeletal: Negative for myalgias, arthralgias and neck pain.  Skin: Negative for rash.  Neurological: Negative for dizziness, seizures, syncope and headaches.  Hematological: Negative for adenopathy. Does not bruise/bleed easily.  Psychiatric/Behavioral: Negative for dysphoric mood. The patient is not nervous/anxious.    Per HPI unless specifically indicated in ROS section     Objective:    BP 144/94 mmHg  Pulse 68  Temp(Src) 98.3 F (36.8 C) (Oral)  Ht 5' 10.5" (1.791 m)  Wt 304 lb (137.893 kg)  BMI 42.99 kg/m2  Wt Readings from Last 3 Encounters:  11/20/15 304 lb (137.893 kg)  06/15/15 300 lb (136.079 kg)  11/18/14 298 lb (135.172 kg)    Physical Exam  Constitutional: He is oriented to person, place, and time. He appears well-developed and well-nourished. No distress.  HENT:  Head: Normocephalic and atraumatic.  Right Ear: Hearing, tympanic membrane, external ear and ear canal normal.  Left Ear: Hearing, tympanic membrane, external ear and ear canal normal.  Nose: Nose normal.  Mouth/Throat: Uvula is midline, oropharynx is clear and moist and mucous membranes are normal. No oropharyngeal exudate, posterior oropharyngeal edema or posterior oropharyngeal erythema.  Eyes: Conjunctivae and EOM are normal. Pupils are equal, round, and reactive to light. No scleral icterus.  Neck: Normal range of motion. Neck supple. No thyromegaly present.  Cardiovascular: Normal rate, regular rhythm, normal heart sounds and intact distal pulses.   No murmur heard. Pulses:      Radial pulses are 2+  on the right side, and 2+ on the left side.  Pulmonary/Chest: Effort normal and breath sounds normal. No respiratory distress. He has no wheezes. He has no rales.  Abdominal: Soft. Bowel sounds are normal. He exhibits no distension and no mass. There is no tenderness. There is  no rebound and no guarding.  Genitourinary: Prostate normal. Rectal exam shows external hemorrhoid (sm non inflammed). Rectal exam shows no internal hemorrhoid, no fissure, no mass, no tenderness and anal tone normal. Prostate is not enlarged (20gm) and not tender.  Musculoskeletal: Normal range of motion. He exhibits no edema.  Lymphadenopathy:    He has no cervical adenopathy.  Neurological: He is alert and oriented to person, place, and time.  CN grossly intact, station and gait intact  Skin: Skin is warm and dry. No rash noted.  Psychiatric: He has a normal mood and affect. His behavior is normal. Judgment and thought content normal.  Nursing note and vitals reviewed.  Results for orders placed or performed during the hospital encounter of 06/15/15  CBC with Differential  Result Value Ref Range   WBC 5.8 3.8 - 10.6 K/uL   RBC 4.63 4.40 - 5.90 MIL/uL   Hemoglobin 14.3 13.0 - 18.0 g/dL   HCT 43.1 40.0 - 52.0 %   MCV 93.0 80.0 - 100.0 fL   MCH 30.9 26.0 - 34.0 pg   MCHC 33.2 32.0 - 36.0 g/dL   RDW 13.3 11.5 - 14.5 %   Platelets 178 150 - 440 K/uL   Neutrophils Relative % 56 %   Neutro Abs 3.3 1.4 - 6.5 K/uL   Lymphocytes Relative 26 %   Lymphs Abs 1.5 1.0 - 3.6 K/uL   Monocytes Relative 13 %   Monocytes Absolute 0.7 0.2 - 1.0 K/uL   Eosinophils Relative 5 %   Eosinophils Absolute 0.3 0 - 0.7 K/uL   Basophils Relative 0 %   Basophils Absolute 0.0 0 - 0.1 K/uL  Basic metabolic panel  Result Value Ref Range   Sodium 139 135 - 145 mmol/L   Potassium 4.2 3.5 - 5.1 mmol/L   Chloride 103 101 - 111 mmol/L   CO2 29 22 - 32 mmol/L   Glucose, Bld 99 65 - 99 mg/dL   BUN 17 6 - 20 mg/dL   Creatinine, Ser 1.16 0.61 - 1.24 mg/dL   Calcium 9.2 8.9 - 10.3 mg/dL   GFR calc non Af Amer >60 >60 mL/min   GFR calc Af Amer >60 >60 mL/min   Anion gap 7 5 - 15  Brain natriuretic peptide  Result Value Ref Range   B Natriuretic Peptide 23.0 0.0 - 100.0 pg/mL  Troponin I  Result Value Ref  Range   Troponin I <0.03 <0.031 ng/mL      Assessment & Plan:  Stool kit mailed to patient. Problem List Items Addressed This Visit    Prediabetes    Check labs.      Obesity, Class III, BMI 40-49.9 (morbid obesity) ([place])    Discussed healthy diet and lifestyle changes to affect sustainable weight loss.      Relevant Orders   Lipid panel   Comprehensive metabolic panel   TSH   Healthcare maintenance - Primary    Preventative protocols reviewed and updated unless pt declined. Discussed healthy diet and lifestyle.       Ex-smoker    Congratulated on smoking cessation. Currently using e-cig without nicotine. Feels abstinence is sustainable.      Elevated blood pressure  reading    Pt will monitor bp at home and bring log in 2-3 wks to review. Discussed incorporating regular exercise into routine - previously was walking daily in am.       Other Visit Diagnoses    Special screening for malignant neoplasms, colon        Relevant Orders    Fecal occult blood, imunochemical    Special screening for malignant neoplasm of prostate        Relevant Orders    PSA    Screening for HIV (human immunodeficiency virus)        Relevant Orders    HIV antibody    Need for hepatitis C screening test        Relevant Orders    Hepatitis C antibody, reflex        Follow up plan: Return in about 1 year (around 11/19/2016), or as needed, for annual exam, prior fasting for blood work.

## 2015-11-20 NOTE — Assessment & Plan Note (Signed)
Congratulated on smoking cessation. Currently using e-cig without nicotine. Feels abstinence is sustainable.

## 2015-11-20 NOTE — Assessment & Plan Note (Signed)
Pt will monitor bp at home and bring log in 2-3 wks to review. Discussed incorporating regular exercise into routine - previously was walking daily in am.

## 2015-11-20 NOTE — Assessment & Plan Note (Signed)
Preventative protocols reviewed and updated unless pt declined. Discussed healthy diet and lifestyle.  

## 2015-11-20 NOTE — Assessment & Plan Note (Signed)
Check labs 

## 2015-12-06 HISTORY — PX: CARDIOVASCULAR STRESS TEST: SHX262

## 2015-12-23 ENCOUNTER — Observation Stay (HOSPITAL_BASED_OUTPATIENT_CLINIC_OR_DEPARTMENT_OTHER)
Admit: 2015-12-23 | Discharge: 2015-12-23 | Disposition: A | Discharge status: Home / Self Care | Payer: Managed Care, Other (non HMO) | Attending: Internal Medicine | Admitting: Internal Medicine

## 2015-12-23 ENCOUNTER — Telehealth: Payer: Self-pay | Admitting: Family Medicine

## 2015-12-23 ENCOUNTER — Emergency Department: Payer: Managed Care, Other (non HMO)

## 2015-12-23 ENCOUNTER — Observation Stay
Admission: EM | Admit: 2015-12-23 | Discharge: 2015-12-24 | Disposition: A | Discharge status: Home / Self Care | Payer: Managed Care, Other (non HMO) | Attending: Internal Medicine | Admitting: Internal Medicine

## 2015-12-23 ENCOUNTER — Other Ambulatory Visit: Payer: Managed Care, Other (non HMO)

## 2015-12-23 ENCOUNTER — Encounter: Payer: Self-pay | Admitting: *Deleted

## 2015-12-23 DIAGNOSIS — E119 Type 2 diabetes mellitus without complications: Secondary | ICD-10-CM | POA: Diagnosis not present

## 2015-12-23 DIAGNOSIS — Z6841 Body Mass Index (BMI) 40.0 and over, adult: Secondary | ICD-10-CM | POA: Diagnosis not present

## 2015-12-23 DIAGNOSIS — Z833 Family history of diabetes mellitus: Secondary | ICD-10-CM | POA: Insufficient documentation

## 2015-12-23 DIAGNOSIS — Z8249 Family history of ischemic heart disease and other diseases of the circulatory system: Secondary | ICD-10-CM | POA: Diagnosis not present

## 2015-12-23 DIAGNOSIS — Z87891 Personal history of nicotine dependence: Secondary | ICD-10-CM | POA: Diagnosis not present

## 2015-12-23 DIAGNOSIS — R0602 Shortness of breath: Secondary | ICD-10-CM | POA: Diagnosis not present

## 2015-12-23 DIAGNOSIS — I071 Rheumatic tricuspid insufficiency: Secondary | ICD-10-CM | POA: Diagnosis not present

## 2015-12-23 DIAGNOSIS — I481 Persistent atrial fibrillation: Secondary | ICD-10-CM

## 2015-12-23 DIAGNOSIS — Z7982 Long term (current) use of aspirin: Secondary | ICD-10-CM | POA: Insufficient documentation

## 2015-12-23 DIAGNOSIS — I4891 Unspecified atrial fibrillation: Secondary | ICD-10-CM | POA: Diagnosis not present

## 2015-12-23 DIAGNOSIS — R079 Chest pain, unspecified: Secondary | ICD-10-CM | POA: Insufficient documentation

## 2015-12-23 DIAGNOSIS — Z79899 Other long term (current) drug therapy: Secondary | ICD-10-CM | POA: Diagnosis not present

## 2015-12-23 DIAGNOSIS — Z823 Family history of stroke: Secondary | ICD-10-CM | POA: Insufficient documentation

## 2015-12-23 DIAGNOSIS — I10 Essential (primary) hypertension: Secondary | ICD-10-CM | POA: Insufficient documentation

## 2015-12-23 DIAGNOSIS — E669 Obesity, unspecified: Secondary | ICD-10-CM

## 2015-12-23 DIAGNOSIS — I480 Paroxysmal atrial fibrillation: Secondary | ICD-10-CM | POA: Diagnosis present

## 2015-12-23 LAB — CBC
HCT: 44 % (ref 40.0–52.0)
Hemoglobin: 15.1 g/dL (ref 13.0–18.0)
MCH: 31.3 pg (ref 26.0–34.0)
MCHC: 34.2 g/dL (ref 32.0–36.0)
MCV: 91.5 fL (ref 80.0–100.0)
PLATELETS: 191 10*3/uL (ref 150–440)
RBC: 4.81 MIL/uL (ref 4.40–5.90)
RDW: 13.1 % (ref 11.5–14.5)
WBC: 8 10*3/uL (ref 3.8–10.6)

## 2015-12-23 LAB — BASIC METABOLIC PANEL
Anion gap: 7 (ref 5–15)
BUN: 16 mg/dL (ref 6–20)
CO2: 29 mmol/L (ref 22–32)
Calcium: 9.4 mg/dL (ref 8.9–10.3)
Chloride: 103 mmol/L (ref 101–111)
Creatinine, Ser: 1.04 mg/dL (ref 0.61–1.24)
GFR calc Af Amer: >60 mL/min (ref >60)
Glucose, Bld: 107 mg/dL — ABNORMAL HIGH (ref 65–99)
Potassium: 4.2 mmol/L (ref 3.5–5.1)
SODIUM: 139 mmol/L (ref 135–145)

## 2015-12-23 LAB — TSH: TSH: 1.042 u[IU]/mL (ref 0.350–4.500)

## 2015-12-23 LAB — TROPONIN I: Troponin I: <0.03 ng/mL (ref <0.031)

## 2015-12-23 LAB — FIBRIN DERIVATIVES D-DIMER (ARMC ONLY): Fibrin derivatives D-dimer (ARMC): 408 (ref 0–499)

## 2015-12-23 LAB — MAGNESIUM: Magnesium: 1.9 mg/dL (ref 1.7–2.4)

## 2015-12-23 MED ORDER — SODIUM CHLORIDE 0.9 % IV BOLUS (SEPSIS)
500.0000 mL | Freq: Once | INTRAVENOUS | Status: AC
  Administered 2015-12-23: 500 mL via INTRAVENOUS

## 2015-12-23 MED ORDER — ACETAMINOPHEN 325 MG PO TABS: 650.0000 mg | ORAL_TABLET | Freq: Four times a day (QID) | ORAL | Status: DC | PRN

## 2015-12-23 MED ORDER — METOPROLOL TARTRATE 25 MG PO TABS
25.0000 mg | ORAL_TABLET | Freq: Two times a day (BID) | ORAL | Status: DC
  Administered 2015-12-23: 25 mg via ORAL
  Filled 2015-12-23: qty 1

## 2015-12-23 MED ORDER — ADULT MULTIVITAMIN W/MINERALS CH
1.0000 | ORAL_TABLET | Freq: Every day | ORAL | Status: DC
  Administered 2015-12-24: 1 via ORAL
  Filled 2015-12-23: qty 1

## 2015-12-23 MED ORDER — METOPROLOL TARTRATE 25 MG PO TABS
25.0000 mg | ORAL_TABLET | Freq: Four times a day (QID) | ORAL | Status: DC
  Administered 2015-12-23: 25 mg via ORAL
  Filled 2015-12-23 (×2): qty 1

## 2015-12-23 MED ORDER — SODIUM CHLORIDE 0.9 % IJ SOLN
3.0000 mL | Freq: Two times a day (BID) | INTRAMUSCULAR | Status: DC
  Administered 2015-12-23 – 2015-12-24 (×3): 3 mL via INTRAVENOUS

## 2015-12-23 MED ORDER — ENOXAPARIN SODIUM 100 MG/ML ~~LOC~~ SOLN
SUBCUTANEOUS | Status: AC
  Administered 2015-12-23: 40 mg via SUBCUTANEOUS
  Filled 2015-12-23: qty 1

## 2015-12-23 MED ORDER — ENOXAPARIN SODIUM 40 MG/0.4ML ~~LOC~~ SOLN
40.0000 mg | Freq: Two times a day (BID) | SUBCUTANEOUS | Status: DC
  Administered 2015-12-24: 40 mg via SUBCUTANEOUS
  Filled 2015-12-23: qty 0.4

## 2015-12-23 MED ORDER — MAGNESIUM SULFATE 2 GM/50ML IV SOLN
2.0000 g | Freq: Once | INTRAVENOUS | Status: AC
  Administered 2015-12-23: 2 g via INTRAVENOUS
  Filled 2015-12-23: qty 50

## 2015-12-23 MED ORDER — ENOXAPARIN SODIUM 40 MG/0.4ML ~~LOC~~ SOLN
40.0000 mg | SUBCUTANEOUS | Status: DC
  Administered 2015-12-23: 40 mg via SUBCUTANEOUS

## 2015-12-23 MED ORDER — ASPIRIN 325 MG PO TABS
325.0000 mg | ORAL_TABLET | Freq: Every day | ORAL | Status: DC
  Administered 2015-12-23 – 2015-12-24 (×2): 325 mg via ORAL
  Filled 2015-12-23 (×2): qty 1

## 2015-12-23 MED ORDER — ACETAMINOPHEN 650 MG RE SUPP: 650.0000 mg | Freq: Four times a day (QID) | RECTAL | Status: DC | PRN

## 2015-12-23 NOTE — Progress Notes (Signed)
Skin verified on admission by Maddie.

## 2015-12-23 NOTE — Telephone Encounter (Signed)
Noted. New onset afib, seen by Dr Mariah Milling. Records reviewed. Will have him f/u when discharged.

## 2015-12-23 NOTE — ED Provider Notes (Signed)
[facility] Emergency Department Provider Note  ____________________________________________  Time seen: Approximately 12:57 PM  I have reviewed the triage vital signs and the nursing notes.   HISTORY  Chief Complaint Chest Pain and Shortness of Breath    HPI Eric Salazar is a 55 y.o. male with hypertension, diabetes, obesity who presents for evaluation of shortness of breath for the past 2-3 days, worse with exertion, improves with rest, currently mild. Patient also developed substernal chest pain yesterday evening which has been constant since onset and is also worse with exertion. No vomiting, diarrhea, fevers or chills. He quit smoking 3 months ago. 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   Past Medical History  Diagnosis Date  . Smoker   . Obesity   . Prediabetes   . Hypertension     Patient Active Problem List   Diagnosis Date Noted  . Elevated blood pressure reading 11/07/2014  . Leg pain 12/12/2011  . Prediabetes   . Ex-smoker 06/06/2011  . Obesity, Class III, BMI 40-49.9 (morbid obesity) (HCC) 06/06/2011  . Healthcare maintenance 06/06/2011    Past Surgical History  Procedure Laterality Date  . Mri lumbar  2008    disc protrusion L4/5 with impingement L5 and probably L4 nerve roots  . Dobutamine stress echo  01/2011    for typical/atypical chest pain - good tolerance, 11 METS, no significant abnormalities    Current Outpatient Rx  Name  Route  Sig  Dispense  Refill  . aspirin EC 81 MG tablet   Oral   Take 81 mg by mouth daily.         . Multiple Vitamin (MULTIVITAMIN WITH MINERALS) TABS tablet   Oral   Take 1 tablet by mouth daily.         . vitamin C (ASCORBIC ACID) 500 MG tablet   Oral   Take 500 mg by mouth daily.             Allergies Review of patient's allergies indicates no known allergies.  Family History  Problem  Relation Age of Onset  . Hypertension Mother   . Diabetes Mother     prediabetes  . Coronary artery disease Mother 59    MI  . Hypertension Father   . Stroke Father 57  . Cancer Neg Hx     Social History Social History  Substance Use Topics  . Smoking status: Former Smoker    Types: Cigarettes    Quit date: 09/28/2015  . Smokeless tobacco: Never Used     Comment: varies  . Alcohol Use: 0.0 oz/week    0 Standard drinks or equivalent per week     Comment: occasionally    Review of Systems Constitutional: No fever/chills Eyes: No visual changes. ENT: No sore throat. Cardiovascular: + chest pain. Respiratory: + shortness of breath. Gastrointestinal: No abdominal pain.  No nausea, no vomiting.  No diarrhea.  No constipation. Genitourinary: Negative for dysuria. Musculoskeletal: Negative for back pain. Skin: Negative for rash. Neurological: Negative for headaches, focal weakness or numbness.  10-point ROS otherwise negative.  ____________________________________________   PHYSICAL EXAM:  VITAL SIGNS: ED Triage Vitals  Enc Vitals Group     BP 12/23/15 1142 123/92 mmHg     Pulse Rate 12/23/15 1142 92     Resp 12/23/15 1142 20     Temp 12/23/15 1142 98.6 F (37 C)  Temp Source 12/23/15 1142 Oral     SpO2 12/23/15 1142 95 %     Weight 12/23/15 1142 300 lb (136.079 kg)     Height 12/23/15 1142  (1.803 m)     Head Cir --      Peak Flow --      Pain Score 12/23/15 1150 2     Pain Loc --      Pain Edu? --      Excl. in GC? --     Constitutional: Alert and oriented. Well appearing and in no acute distress. Eyes: Conjunctivae are normal. PERRL. EOMI. Head: Atraumatic. Nose: No congestion/rhinnorhea. Mouth/Throat: Mucous membranes are moist.  Oropharynx non-erythematous. Neck: No stridor.   Cardiovascular: tachycarduc rate, irregular rhythm. Grossly normal heart sounds.  Good peripheral circulation. Respiratory: Normal respiratory effort.  No retractions.  Lungs CTAB. Gastrointestinal: Soft and nontender. No distention.  No CVA tenderness. Genitourinary: deferred Musculoskeletal: No lower extremity tenderness nor edema.  No joint effusions. No calf Asymmetry. Neurologic:  Normal speech and language. No gross focal neurologic deficits are appreciated.  Skin:  Skin is warm, dry and intact. No rash noted. Psychiatric: Mood and affect are normal. Speech and behavior are normal.  ____________________________________________   LABS (all labs ordered are listed, but only abnormal results are displayed)  Labs Reviewed  BASIC METABOLIC PANEL - Abnormal; Notable for the following:    Glucose, Bld 107 (*)    All other components within normal limits  CBC  TROPONIN I  FIBRIN DERIVATIVES D-DIMER ([facility] ONLY)   ____________________________________________  EKG  ED ECG REPORT I, Gayla Doss, the attending physician, personally viewed and interpreted this ECG.   Date: 12/23/2015  EKG Time: 11:35  Rate: 111  Rhythm: atrial fibrillation, rate 111  Axis: normal  Intervals: Normal with the exception of PR interval (which is not calculated in afib)  ST&T Change: No acute ST elevation  ____________________________________________  RADIOLOGY  CXR IMPRESSION: No active cardiopulmonary disease. ____________________________________________   PROCEDURES  Procedure(s) performed: None  Critical Care performed: No  ____________________________________________   INITIAL IMPRESSION / ASSESSMENT AND PLAN / ED COURSE  Pertinent labs & imaging results that were available during my care of the patient were reviewed by me and considered in my medical decision making (see chart for details).  Eric Salazar is a 55 y.o. male with hypertension, diabetes, obesity who presents for evaluation of shortness of breath for the past 2-3 days, worse with exertion, improves with rest, currently mild. On exam, he is very well-appearing and in no acute  distress. EKG and cardiac monitor confirmed new-onset atrial fibrillation with rate of 90s to approximately 110 bpm and maintaining adequate blood pressure. Otherwise, vital signs are stable and he is afebrile. Labs reviewed. Troponin negative. Normal CBC and CMP. Chest x-ray shows no active cardiopulmonary disease. My concern is for new-onset atrial fibrillation with intermittent rapid ventricular rate which could be causing his chest pain and shortness of breath. He certainly  does have risk factors for ACS which could also be contributing to his complaints. Currently his rate is well controlled however he remains in atrial fibrillation. He has no history of atrial fibrillation on chart review, EKG has previously showed normal sinus rhythm. 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 Anticipate admission.  ----------------------------------------- 2:13 PM on 12/23/2015 ----------------------------------------- D-dimer is not elevated. The patient persists in atrial fibrillation with heart rate of 97 bpm. Given my above  concerns, I discussed the case with the hospitalist for admission. The patient would likely benefit from cardiology consult as well as consideration for chronic anticoagulation versus cardioversion given that he might have developed atrial fibrillation within the past few days though exact onset cannot be confirmed.  ____________________________________________   FINAL CLINICAL IMPRESSION(S) / ED DIAGNOSES  Final diagnoses:  Chest pain, unspecified chest pain type  SOB (shortness of breath)  Atrial fibrillation, unspecified type (HCC)      Gayla Doss, MD 12/23/15 1416

## 2015-12-23 NOTE — H&P (Signed)
[facility] [place] [facility]   PATIENT NAME: Eric Salazar    MR#:  [id number]  DATE OF BIRTH:  08-06-1961  DATE OF ADMISSION:  12/23/2015  PRIMARY CARE PHYSICIAN: Eustaquio Boyden, MD   REQUESTING/REFERRING PHYSICIAN: Dr. Chari Manning  CHIEF COMPLAINT:   Chief Complaint  Patient presents with  . Chest Pain  . Shortness of Breath    HISTORY OF PRESENT ILLNESS:  Eric Salazar  is a 55 y.o. male with a known history of hypertension. He presents to the ER with chest pain and shortness of breath. The chest pain started yesterday described as a tightening in his chest 5 out of 10 intensity constant until today now 2 out of 10 intensity. No radiation. Nothing made it better or worse. This was associated with shortness of breath with walking. Of note he did have a similar episode 4-5 months ago. Last week or so he's been winded with activity. No diaphoresis no nausea.  PAST MEDICAL HISTORY:   Past Medical History  Diagnosis Date  . Smoker   . Obesity   . Prediabetes   . Hypertension     PAST SURGICAL HISTORY:   Past Surgical History  Procedure Laterality Date  . Mri lumbar  2008    disc protrusion L4/5 with impingement L5 and probably L4 nerve roots  . Dobutamine stress echo  01/2011    for typical/atypical chest pain - good tolerance, 11 METS, no significant abnormalities    SOCIAL HISTORY:   Social History  Substance Use Topics  . Smoking status: Former Smoker    Types: Cigarettes    Quit date: 09/28/2015  . Smokeless tobacco: Never Used     Comment: varies  . Alcohol Use: 1.8 oz/week    0 Standard drinks or equivalent, 3 Shots of liquor per week     Comment: occasionally    FAMILY HISTORY:   Family History  Problem Relation Age of Onset  . Hypertension Mother   . Diabetes Mother     prediabetes  . Coronary artery disease Mother 66    MI  . Hypertension Father   . Stroke Father 75  . Cancer Neg Hx     DRUG ALLERGIES:   No Known Allergies  REVIEW OF SYSTEMS:  CONSTITUTIONAL: No fever, fatigue with walking. Trying to loose weight EYES: No blurred or double vision.  EARS, NOSE, AND THROAT: No tinnitus or ear pain. Occassional sore throat RESPIRATORY: No cough, Positive shortness of breath, No wheezing or hemoptysis.  CARDIOVASCULAR: Positive chest pain, No orthopnea, edema.  GASTROINTESTINAL: No nausea, vomiting, diarrhea or abdominal pain. No blood in bowel movements GENITOURINARY: No dysuria, hematuria.  ENDOCRINE: No polyuria, nocturia,  HEMATOLOGY: No anemia, easy bruising or bleeding SKIN: No rash or lesion. MUSCULOSKELETAL: Positive joint pains.   NEUROLOGIC: No tingling, numbness, weakness.  PSYCHIATRY: hx  anxiety.   MEDICATIONS AT HOME:   Prior to Admission medications   Medication Sig Start Date End Date Taking? Authorizing Provider  aspirin EC 81 MG tablet Take 81 mg by mouth daily.   Yes Historical Provider, MD  Multiple Vitamin (MULTIVITAMIN WITH MINERALS) TABS tablet Take 1 tablet by mouth daily.   Yes Historical Provider, MD  vitamin C (ASCORBIC ACID) 500 MG tablet Take 500 mg by mouth daily.     Yes Historical Provider, MD      VITAL SIGNS:  Blood pressure 120/62, pulse 81, temperature 98.6 F (37 C), temperature source Oral, resp. rate 21, height 5'  11" (1.803 m), weight 136.079 kg (300 lb), SpO2 99 %.  PHYSICAL EXAMINATION:  GENERAL:  55 y.o.-year-old patient lying in the bed with no acute distress.  EYES: Pupils equal, round, reactive to light and accommodation. No scleral icterus. Extraocular muscles intact.  HEENT: Head atraumatic, normocephalic. Oropharynx and nasopharynx clear.  NECK:  Supple, no jugular venous distention. No thyroid enlargement, no tenderness.  LUNGS: Normal breath sounds bilaterally, no wheezing, rales,rhonchi or crepitation. No use of accessory muscles of respiration.  CARDIOVASCULAR: S1, S2 irregularly irregular. No murmurs, rubs, or gallops.   ABDOMEN: Soft, nontender, nondistended. Bowel sounds present. No organomegaly or mass.  EXTREMITIES: 1+ edema, no cyanosis, or clubbing.  NEUROLOGIC: Cranial nerves II through XII are intact. Muscle strength 5/5 in all extremities. Sensation intact. Gait not checked.  PSYCHIATRIC: The patient is alert and oriented x 3.  SKIN: No rash, lesion, or ulcer.   LABORATORY PANEL:   CBC  Recent Labs Lab 12/23/15 1150  WBC 8.0  HGB 15.1  HCT 44.0  PLT 191   ------------------------------------------------------------------------------------------------------------------  Chemistries   Recent Labs Lab 12/23/15 1150  NA 139  K 4.2  CL 103  CO2 29  GLUCOSE 107*  BUN 16  CREATININE 1.04  CALCIUM 9.4   ------------------------------------------------------------------------------------------------------------------  Cardiac Enzymes  Recent Labs Lab 12/23/15 1150  TROPONINI <0.03   ------------------------------------------------------------------------------------------------------------------  RADIOLOGY:  Dg Chest 2 View  12/23/2015  CLINICAL DATA:  Acute chest pain. EXAM: CHEST  2 VIEW COMPARISON:  June 15, 2015. FINDINGS: The heart size and mediastinal contours are within normal limits. Both lungs are clear. No pneumothorax or pleural effusion is noted. The visualized skeletal structures are unremarkable. IMPRESSION: No active cardiopulmonary disease. Electronically Signed   By: Lupita Raider, M.D.   On: 12/23/2015 12:28    EKG:   Atrial fibrillation 111 bpm  IMPRESSION AND PLAN:   1. Atrial fibrillation with chest pain and shortness of breath. I will admit as an observation to telemetry. Case discussed with Dr. Mariah Milling cardiology to see the patient in consultation. I will give 2 g of IV magnesium. Start low-dose beta blocker. Aspirin for anticoagulation. Obtain an echocardiogram. Serial cardiac enzymes. Cardiology to decide on any further testing or not. 2. Essential  hypertension continue metoprolol 3. Obesity weight loss needed 4. Former smoker  All the records are reviewed and case discussed with ED provider. Management plans discussed with the patient, family and they are in agreement.  CODE STATUS: Full code TOTAL TIME TAKING CARE OF THIS PATIENT: 50 minutes.    Alford Highland M.D on 12/23/2015 at 3:40 PM  Between 7am to 6pm - Pager - [phone]  After 6pm call admission pager [phone]  [facility]  Office  [phone]  CC: Primary care physician; Eustaquio Boyden, MD

## 2015-12-23 NOTE — ED Notes (Signed)
Pt states he has noticed some progressive SOB for the past few days, states some chest pain that began last night, EKG states a-fib, pt denies any known hx of a-fib

## 2015-12-23 NOTE — Consult Note (Signed)
Cardiology Consultation Note  Patient ID: Eric Salazar, MRN: [id number], DOB/AGE: 01/31/1961 55 y.o. Admit date: 12/23/2015   Date of Consult: 12/23/2015 Primary Physician: Eustaquio Boyden, MD Primary Cardiologist: New to [facility]  Chief Complaint: SOB and chest pain Reason for Consult: New onset Afib with RVR  HPI: 55 y.o. male with h/o HTN, and obesity who presented to the [facility] ED on 1/18 with SOB and chest pain and was found to be in new onset Afib with RVR with HR in the 1-teens.   He was previously seen by outside cardology group for a reason he does not remember and underwent a stress test that was reportedly normal. No prior cardiac caths. He has not followed up with that group since. He presented to the [facility] ED in July 2016 for SOB. Normal BNP and cardiac enzymes. He was in sinus rhythm at that time. He was advised to follow up with his PCP.   He has been dealing with increased SOB and generalized fatigue for the past several weeks that has been worsening. 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 Weight has been stable. No LEE. This has also been coupled with exertional chest pain. Because of this he presented to the ED again. Upon his arrival he was found to be in new onset Afib with RVR with HR in the 1-teens. K+ 4.2, negative troponin, TSH is pending, negative d dimer, unremarkable CBC, cxr without acute process, ECG with Afib with RVR, 111 bpm. He received metoprolol 25 mg x 1 with improvement in HR to the upper 90's. Upon entering the room he is resting comfortably with his feet propped up on a chair watching TV.        Past Medical History  Diagnosis Date  . Smoker   . Obesity   . Prediabetes   . Hypertension   . Atrial fibrillation (HCC) 12/2015      Most Recent Cardiac Studies: As above   Surgical History:  Past Surgical History  Procedure Laterality Date  . Mri lumbar  2008    disc protrusion L4/5 with impingement L5 and  probably L4 nerve roots  . Dobutamine stress echo  01/2011    for typical/atypical chest pain - good tolerance, 11 METS, no significant abnormalities     Home Meds: Prior to Admission medications   Medication Sig Start Date End Date Taking? Authorizing Provider  aspirin EC 81 MG tablet Take 81 mg by mouth daily.   Yes Historical Provider, MD  Multiple Vitamin (MULTIVITAMIN WITH MINERALS) TABS tablet Take 1 tablet by mouth daily.   Yes Historical Provider, MD  vitamin C (ASCORBIC ACID) 500 MG tablet Take 500 mg by mouth daily.     Yes Historical Provider, MD    Inpatient Medications:  . aspirin  325 mg Oral Daily  . enoxaparin (LOVENOX) injection  40 mg Subcutaneous Q24H  . metoprolol tartrate  25 mg Oral BID  . sodium chloride  3 mL Intravenous Q12H   . magnesium sulfate 1 - 4 g bolus IVPB 2 g (12/23/15 1619)    Allergies: No Known Allergies  Social History   Social History  . Marital Status: Married    Spouse Name: N/A  . Number of Children: 1  . Years of Education: 2 yr coll   Occupational History  . Cabin crew   Social History Main Topics  . Smoking status: Former Smoker  Types: Cigarettes    Quit date: 09/28/2015  . Smokeless tobacco: Never Used     Comment: varies  . Alcohol Use: 1.8 oz/week    0 Standard drinks or equivalent, 3 Shots of liquor per week     Comment: occasionally  . Drug Use: No  . Sexual Activity: Not on file   Other Topics Concern  . Not on file   Social History Narrative   Caffeine: rare   Lives with wife, son (2002), 3 Social research officer, government.   Edu: 2 years college   Activity: not much, at work, playing with son   Diet: good fruits/vegetables     Family History  Problem Relation Age of Onset  . Hypertension Mother   . Diabetes Mother     prediabetes  . Coronary artery disease Mother 19    MI  . Hypertension Father   . Stroke Father 68  . Cancer Neg Hx      Review of Systems: Review  of Systems  Constitutional: Positive for malaise/fatigue. Negative for fever, chills and weight loss.  HENT: Negative for congestion.   Eyes: Negative for discharge and redness.  Respiratory: Positive for shortness of breath. Negative for cough, hemoptysis, sputum production and wheezing.   Cardiovascular: Positive for palpitations. Negative for chest pain, orthopnea, claudication, leg swelling and PND.  Gastrointestinal: Negative for nausea and vomiting.  Musculoskeletal: Negative for falls.  Skin: Negative for rash.  Neurological: Positive for weakness. Negative for dizziness, tingling, tremors, sensory change, speech change and loss of consciousness.  Endo/Heme/Allergies: Does not bruise/bleed easily.  Psychiatric/Behavioral: Negative for substance abuse. The patient is not nervous/anxious.   All other systems reviewed and are negative.   Labs:  Recent Labs  12/23/15 1150  TROPONINI <0.03   Lab Results  Component Value Date   WBC 8.0 12/23/2015   HGB 15.1 12/23/2015   HCT 44.0 12/23/2015   MCV 91.5 12/23/2015   PLT 191 12/23/2015     Recent Labs Lab 12/23/15 1150  NA 139  K 4.2  CL 103  CO2 29  BUN 16  CREATININE 1.04  CALCIUM 9.4  GLUCOSE 107*   Lab Results  Component Value Date   CHOL 179 11/18/2014   HDL 38.20* 11/18/2014   LDLCALC 125* 11/18/2014   TRIG 77.0 11/18/2014   No results found for: DDIMER  Radiology/Studies:  Dg Chest 2 View  12/23/2015  CLINICAL DATA:  Acute chest pain. EXAM: CHEST  2 VIEW COMPARISON:  June 15, 2015. FINDINGS: The heart size and mediastinal contours are within normal limits. Both lungs are clear. No pneumothorax or pleural effusion is noted. The visualized skeletal structures are unremarkable. IMPRESSION: No active cardiopulmonary disease. Electronically Signed   By: Lupita Raider, M.D.   On: 12/23/2015 12:28    EKG: Afib with RVR, 111 bpm, poor R wave progression  Weights: Filed Weights   12/23/15 1142  Weight: 300  lb (136.079 kg)     Physical Exam: Blood pressure 130/7, pulse 80, temperature 98.6 F (37 C), temperature source Oral, resp. rate 22, height  (1.803 m), weight 300 lb (136.079 kg), SpO2 99 %. Body mass index is 41.86 kg/(m^2). General: Well developed, well nourished, in no acute distress. Head: Normocephalic, atraumatic, sclera non-icteric, no xanthomas, nares are without discharge.  Neck: Negative for carotid bruits. JVD not elevated. Lungs: Clear bilaterally to auscultation without wheezes, rales, or rhonchi. Breathing is unlabored. Heart: Irregularly-irregular, with S1 S2. No murmurs, rubs, or  gallops appreciated. Abdomen: Obese, soft, non-tender, non-distended with normoactive bowel sounds. No hepatomegaly. No rebound/guarding. No obvious abdominal masses. Msk:  Strength and tone appear normal for age. Extremities: No clubbing or cyanosis. No edema.  Distal pedal pulses are 2+ and equal bilaterally. Neuro: Alert and oriented X 3. No facial asymmetry. No focal deficit. Moves all extremities spontaneously. Psych:  Responds to questions appropriately with a normal affect.    Assessment and Plan:   1. New onset Afib with RVR/chest pain/SOB: -Rate is improved s/p metoprolol 25 mg x 1 as above -It is uncertain to know exactly how long he has been in Afib given his history and symptoms -Uncertain inciting event at this time -Would aim for rate control at this time -Lopressor 25 mg q 6 hours -Would start full dose anticoagulation in case he does not convert on his own and requires DCCV in the future for conversion from Afib to sinus -CHADS2VASc at least 1 (HTN) -He would not need long term full dose anticoagulation at this time with current data known, as risks would out weigh benefits -Could have patient invest in Alive Cor to assess for further episodes of Afib as well -Check echo to evaluate LV function, wall motion, and right-sided pressure -Check urine drug screen -TSH  pending -Check Mg++ -He is obese which is likely playing a role in this as well, cannot rule out OSA/OHS which may be playing a role as well -If rate is well controlled in the morning would pursue nuclear stress testing  2. HTN: -Continue Lopressor 25 mg q 6 hours  3. Obesity/possible OSA/OHS: -As above   SignedEula Listen, PA-C Pager: [phone] 12/23/2015, 4:23 PM

## 2015-12-23 NOTE — Progress Notes (Addendum)
Anticoagulation monitoring(Lovenox):  55 yo  ordered Lovenox 40 mg Q24h  Filed Weights   12/23/15 1142  Weight: 300 lb (136.079 kg)   BMI 41.9  Lab Results  Component Value Date   CREATININE 1.04 12/23/2015   CREATININE 1.16 06/15/2015   CREATININE 1.2 11/18/2014   Estimated Creatinine Clearance: 114.4 mL/min (by C-G formula based on Cr of 1.04). Hemoglobin & Hematocrit     Component Value Date/Time   HGB 15.1 12/23/2015 1150   HCT 44.0 12/23/2015 1150     Per Protocol for Patient with estCrcl< 30 ml/min and BMI < 40, will transition to Lovenox 40 mg [id number].

## 2015-12-23 NOTE — Telephone Encounter (Signed)
Pt called he is at [facility] with chest pain and shortness of breath He stated they were going to admitt him

## 2015-12-24 ENCOUNTER — Telehealth: Payer: Self-pay

## 2015-12-24 ENCOUNTER — Observation Stay (HOSPITAL_BASED_OUTPATIENT_CLINIC_OR_DEPARTMENT_OTHER): Payer: Managed Care, Other (non HMO)

## 2015-12-24 DIAGNOSIS — R079 Chest pain, unspecified: Secondary | ICD-10-CM

## 2015-12-24 DIAGNOSIS — I480 Paroxysmal atrial fibrillation: Secondary | ICD-10-CM

## 2015-12-24 LAB — CBC
HCT: 44.3 % (ref 40.0–52.0)
HEMOGLOBIN: 14.9 g/dL (ref 13.0–18.0)
MCH: 31 pg (ref 26.0–34.0)
MCHC: 33.7 g/dL (ref 32.0–36.0)
MCV: 91.9 fL (ref 80.0–100.0)
PLATELETS: 188 10*3/uL (ref 150–440)
RBC: 4.82 MIL/uL (ref 4.40–5.90)
RDW: 13.5 % (ref 11.5–14.5)
WBC: 6.7 10*3/uL (ref 3.8–10.6)

## 2015-12-24 LAB — NM MYOCAR MULTI W/SPECT W/WALL MOTION / EF
CHL CUP NUCLEAR SDS: 3
CHL CUP NUCLEAR SRS: 2
CHL CUP RESTING HR STRESS: 71 {beats}/min
LV sys vol: 60 mL
LVDIAVOL: 133 mL
Peak HR: 93 {beats}/min
Percent HR: 56 %
SSS: 5
TID: 1.13

## 2015-12-24 LAB — BASIC METABOLIC PANEL
ANION GAP: 5 (ref 5–15)
BUN: 16 mg/dL (ref 6–20)
CALCIUM: 9.2 mg/dL (ref 8.9–10.3)
CO2: 32 mmol/L (ref 22–32)
CREATININE: 1.19 mg/dL (ref 0.61–1.24)
Chloride: 103 mmol/L (ref 101–111)
GFR calc Af Amer: >60 mL/min (ref >60)
GLUCOSE: 102 mg/dL — AB (ref 65–99)
Potassium: 4.5 mmol/L (ref 3.5–5.1)
Sodium: 140 mmol/L (ref 135–145)

## 2015-12-24 LAB — URINE DRUG SCREEN, QUALITATIVE (ARMC ONLY)
AMPHETAMINES, UR SCREEN: NOT DETECTED
Barbiturates, Ur Screen: NOT DETECTED
Benzodiazepine, Ur Scrn: NOT DETECTED
Cannabinoid 50 Ng, Ur ~~LOC~~: POSITIVE — AB
Cocaine Metabolite,Ur ~~LOC~~: NOT DETECTED
MDMA (ECSTASY) UR SCREEN: NOT DETECTED
METHADONE SCREEN, URINE: NOT DETECTED
OPIATE, UR SCREEN: NOT DETECTED
PHENCYCLIDINE (PCP) UR S: NOT DETECTED
Tricyclic, Ur Screen: NOT DETECTED

## 2015-12-24 MED ORDER — REGADENOSON 0.4 MG/5ML IV SOLN
0.4000 mg | Freq: Once | INTRAVENOUS | Status: AC
  Administered 2015-12-24: 0.4 mg via INTRAVENOUS

## 2015-12-24 MED ORDER — METOPROLOL TARTRATE 25 MG PO TABS: 25.0000 mg | ORAL_TABLET | Freq: Two times a day (BID) | ORAL | Status: DC

## 2015-12-24 MED ORDER — TECHNETIUM TC 99M SESTAMIBI - CARDIOLITE
31.5350 | Freq: Once | INTRAVENOUS | Status: AC | PRN
  Administered 2015-12-24: 12:00:00 31.535 via INTRAVENOUS

## 2015-12-24 MED ORDER — TECHNETIUM TC 99M SESTAMIBI - CARDIOLITE
12.6500 | Freq: Once | INTRAVENOUS | Status: AC | PRN
  Administered 2015-12-24: 11:00:00 12.65 via INTRAVENOUS

## 2015-12-24 NOTE — Discharge Summary (Signed)
[facility] - [place] at [facility]   PATIENT NAME: Eric Salazar    MR#:  [id number]  DATE OF BIRTH:  1961/08/27  DATE OF ADMISSION:  12/23/2015 ADMITTING PHYSICIAN: Alford Highland, MD  DATE OF DISCHARGE: 12/24/2015 PRIMARY CARE PHYSICIAN: Eustaquio Boyden, MD    ADMISSION DIAGNOSIS:  SOB (shortness of breath) [R06.02] Chest pain, unspecified chest pain type [R07.9] Atrial fibrillation, unspecified type (HCC) [I48.91]   DISCHARGE DIAGNOSIS:   new-onset A. fib  SECONDARY DIAGNOSIS:   Past Medical History  Diagnosis Date  . Smoker   . Obesity   . Prediabetes   . Hypertension   . Atrial fibrillation (HCC) 12/2015    HOSPITAL COURSE:   1. New onset Atrial fibrillation. Troponins are negative. He has been treated with Lopressor 25 mg twice a day. A. fib converted to normal sinus rhythm. No need for long-term anticoagulation at the present time per cardiology. Echocardiogram is normal. Stress test is normal, patient may be discharged to home today per Dr. Mariah Milling. 2. Essential hypertension continue metoprolol 3. Obesity weight loss needed  DISCHARGE CONDITIONS:   Stable, discharged to home today.  CONSULTS OBTAINED:  Treatment Team:  Antonieta Iba, MD  DRUG ALLERGIES:  No Known Allergies  DISCHARGE MEDICATIONS:   Current Discharge Medication List    START taking these medications   Details  metoprolol tartrate (LOPRESSOR) 25 MG tablet Take 1 tablet (25 mg total) by mouth 2 (two) times daily. Qty: 60 tablet, Refills: 0      CONTINUE these medications which have NOT CHANGED   Details  aspirin EC 81 MG tablet Take 81 mg by mouth daily.    Multiple Vitamin (MULTIVITAMIN WITH MINERALS) TABS tablet Take 1 tablet by mouth daily.    vitamin C (ASCORBIC ACID) 500 MG tablet Take 500 mg by mouth daily.           DISCHARGE INSTRUCTIONS:    If you experience worsening of your admission symptoms, develop shortness of breath, life  threatening emergency, suicidal or homicidal thoughts you must seek medical attention immediately by calling 911 or calling your MD immediately  if symptoms less severe.  You Must read complete instructions/literature along with all the possible adverse reactions/side effects for all the Medicines you take and that have been prescribed to you. Take any new Medicines after you have completely understood and accept all the possible adverse reactions/side effects.   Please note  You were cared for by a hospitalist during your hospital stay. If you have any questions about your discharge medications or the care you received while you were in the hospital after you are discharged, you can call the unit and asked to speak with the hospitalist on call if the hospitalist that took care of you is not available. Once you are discharged, your primary care physician will handle any further medical issues. Please note that NO REFILLS for any discharge medications will be authorized once you are discharged, as it is imperative that you return to your primary care physician (or establish a relationship with a primary care physician if you do not have one) for your aftercare needs so that they can reassess your need for medications and monitor your lab values.    Today   SUBJECTIVE    no complaint.    VITAL SIGNS:  Blood pressure 131/75, pulse 66, temperature 97.9 F (36.6 C), temperature source Oral, resp. rate 18, height  (1.803 m), weight 131.226 kg (289 lb 4.8 oz),  SpO2 99 %.  I/O:   Intake/Output Summary (Last 24 hours) at 12/24/15 1521 Last data filed at 12/24/15 1400  Gross per 24 hour  Intake    290 ml  Output    925 ml  Net   -635 ml    PHYSICAL EXAMINATION:  GENERAL:  55 y.o.-year-old patient lying in the bed with no acute distress.  obese.  EYES: Pupils equal, round, reactive to light and accommodation. No scleral icterus. Extraocular muscles intact.  HEENT: Head atraumatic,  normocephalic. Oropharynx and nasopharynx clear.  NECK:  Supple, no jugular venous distention. No thyroid enlargement, no tenderness.  LUNGS: Normal breath sounds bilaterally, no wheezing, rales,rhonchi or crepitation. No use of accessory muscles of respiration.  CARDIOVASCULAR: S1, S2 normal. No murmurs, rubs, or gallops.  ABDOMEN: Soft, non-tender, non-distended. Bowel sounds present. No organomegaly or mass.  EXTREMITIES: No pedal edema, cyanosis, or clubbing.  NEUROLOGIC: Cranial nerves II through XII are intact. Muscle strength 5/5 in all extremities. Sensation intact. Gait not checked.  PSYCHIATRIC: The patient is alert and oriented x 3.  SKIN: No obvious rash, lesion, or ulcer.   DATA REVIEW:   CBC  Recent Labs Lab 12/24/15 0621  WBC 6.7  HGB 14.9  HCT 44.3  PLT 188    Chemistries   Recent Labs Lab 12/23/15 1150 12/24/15 0621  NA 139 140  K 4.2 4.5  CL 103 103  CO2 29 32  GLUCOSE 107* 102*  BUN 16 16  CREATININE 1.04 1.19  CALCIUM 9.4 9.2  MG 1.9  --     Cardiac Enzymes  Recent Labs Lab 12/23/15 1150  TROPONINI <0.03    Microbiology Results  No results found for this or any previous visit.  RADIOLOGY:  Dg Chest 2 View  12/23/2015  CLINICAL DATA:  Acute chest pain. EXAM: CHEST  2 VIEW COMPARISON:  June 15, 2015. FINDINGS: The heart size and mediastinal contours are within normal limits. Both lungs are clear. No pneumothorax or pleural effusion is noted. The visualized skeletal structures are unremarkable. IMPRESSION: No active cardiopulmonary disease. Electronically Signed   By: Lupita Raider, M.D.   On: 12/23/2015 12:28   Nm Myocar Multi W/spect W/wall Motion / Ef  12/24/2015   There was no ST segment deviation noted during stress.  T wave inversion was noted during stress in the III leads, beginning at 0 minutes of stress.  Defect 1: There is a medium defect of mild severity present in the mid anterior and apical anterior location. This defect is  fixed and likely due to artifact.  The study is normal.  This is a low risk study.  The left ventricular ejection fraction is normal (55-65%).  This study is suboptimal due to GI uptake bordering the inferior wall.        I discussed with Dr. Mariah Milling. Management plans discussed with the patient, family and they are in agreement.  CODE STATUS:     Code Status Orders        Start     Ordered   12/23/15 1539  Full code   Continuous     12/23/15 1539    Code Status History    Date Active Date Inactive Code Status Order ID Comments User Context   This patient has a current code status but no historical code status.      TOTAL TIME TAKING CARE OF THIS PATIENT: 35 minutes.    Shaune Pollack M.D on 12/24/2015 at 3:21 PM  Between  7am to 6pm - Pager - [phone]  After 6pm go to [email or web address] - password [email or web address] [facility]  [facility]  Office  [phone]  CC: Primary care physician; Eustaquio Boyden, MD

## 2015-12-24 NOTE — Telephone Encounter (Signed)
-----   Message from Eric Salazar sent at 12/24/2015  9:43 AM EST ----- Regarding: tcm Pt is coming feb 2 to see Ward Givens saw Dr Mariah Milling in hospital

## 2015-12-24 NOTE — Progress Notes (Signed)
Patient received discharge instructions, pt verbalized understanding. IV was removed with no signs of infection. Dressing clean, dry intact. No skin tears or wounds present. Prescription was printed and given to patient. Patient was escorted out with staff member via wheelchair via private auto. No further needs from care management team. Per Dr. Imogene Burn okay for patient to discharge home after stress test.

## 2015-12-24 NOTE — Telephone Encounter (Signed)
Patient contacted regarding discharge from [facility] on 12/24/15.  Patient understands to follow up with Ward Givens, NP on 01/07/16 at 3:00 at [facility]. Patient understands discharge instructions? yes Patient understands medications and regiment? yes Patient understands to bring all medications to this visit? yes

## 2015-12-24 NOTE — Discharge Instructions (Signed)
Heart healthy diet. °Activity as tolerated. °

## 2015-12-24 NOTE — Progress Notes (Signed)
Patient: Eric Salazar / Admit Date: 12/23/2015 / Date of Encounter: 12/24/2015, 10:22 AM   Subjective: Converted to sinus rhythm at 11:24 PM and has remained in sinus since. Less SOB with ambulation since being in sinus rhythm. Still with some chest pressure. He is for [employer] today. Echo showed normal EF at 60-65%, no RWMA, mild concentric LVH.  Review of Systems: Review of Systems  Constitutional: Positive for malaise/fatigue. Negative for fever, chills, weight loss and diaphoresis.  HENT: Negative for congestion.   Eyes: Negative for discharge and redness.  Respiratory: Negative for cough, hemoptysis, sputum production, shortness of breath and wheezing.   Cardiovascular: Negative for chest pain, palpitations, orthopnea, claudication, leg swelling and PND.       Chest pressure  Gastrointestinal: Negative for nausea, vomiting and abdominal pain.  Musculoskeletal: Negative for falls.  Skin: Negative for rash.  Neurological: Negative for sensory change, speech change, focal weakness, loss of consciousness and weakness.  Endo/Heme/Allergies: Does not bruise/bleed easily.  Psychiatric/Behavioral: The patient is not nervous/anxious.        Smoked E cig's  All other systems reviewed and are negative.   Objective: Telemetry: sinus rhythm, 60's Physical Exam: Blood pressure 104/35, pulse 71, temperature 97.4 F (36.3 C), temperature source Oral, resp. rate 18, height  (1.803 m), weight 289 lb 4.8 oz (131.226 kg), SpO2 95 %. Body mass index is 40.37 kg/(m^2). General: Well developed, well nourished, in no acute distress. Head: Normocephalic, atraumatic, sclera non-icteric, no xanthomas, nares are without discharge. Neck: Negative for carotid bruits. JVP not elevated. Lungs: Clear bilaterally to auscultation without wheezes, rales, or rhonchi. Breathing is unlabored. Heart: RRR S1 S2 without murmurs, rubs, or gallops.  Abdomen: Soft, non-tender, non-distended with  normoactive bowel sounds. No rebound/guarding. Extremities: No clubbing or cyanosis. No edema. Distal pedal pulses are 2+ and equal bilaterally. Neuro: Alert and oriented X 3. Moves all extremities spontaneously. Psych:  Responds to questions appropriately with a normal affect.   Intake/Output Summary (Last 24 hours) at 12/24/15 1022 Last data filed at 12/24/15 0900  Gross per 24 hour  Intake    290 ml  Output    800 ml  Net   -510 ml    Inpatient Medications:  . aspirin  325 mg Oral Daily  . enoxaparin (LOVENOX) injection  40 mg Subcutaneous Q12H  . metoprolol tartrate  25 mg Oral Q6H  . multivitamin with minerals  1 tablet Oral Daily  . sodium chloride  3 mL Intravenous Q12H   Infusions:    Labs:  Recent Labs  12/23/15 1150 12/24/15 0621  NA 139 140  K 4.2 4.5  CL 103 103  CO2 29 32  GLUCOSE 107* 102*  BUN 16 16  CREATININE 1.04 1.19  CALCIUM 9.4 9.2  MG 1.9  --    No results for input(s): AST, ALT, ALKPHOS, BILITOT, PROT, ALBUMIN in the last 72 hours.  Recent Labs  12/23/15 1150 12/24/15 0621  WBC 8.0 6.7  HGB 15.1 14.9  HCT 44.0 44.3  MCV 91.5 91.9  PLT 191 188    Recent Labs  12/23/15 1150  TROPONINI <0.03   Invalid input(s): POCBNP No results for input(s): HGBA1C in the last 72 hours.   Weights: Filed Weights   12/23/15 1142 12/23/15 1656  Weight: 300 lb (136.079 kg) 289 lb 4.8 oz (131.226 kg)     Radiology/Studies:  Dg Chest 2 View  12/23/2015  CLINICAL DATA:  Acute chest pain. EXAM: CHEST  2 VIEW COMPARISON:  June 15, 2015. FINDINGS: The heart size and mediastinal contours are within normal limits. Both lungs are clear. No pneumothorax or pleural effusion is noted. The visualized skeletal structures are unremarkable. IMPRESSION: No active cardiopulmonary disease. Electronically Signed   By: Lupita Raider, M.D.   On: 12/23/2015 12:28     Assessment and Plan   1. New onset Afib with RVR/chest pain/SOB: -Converted to sinus rhythm at  11:24 PM on 1/18 and has remained in sinus since with controlled HR in the 60's -He is for Lexiscan Myoview this morning to evaluate for high risk ischemia  -It is uncertain to know exactly how long he has been in Afib given his history and symptoms -Uncertain inciting event at this time -Would aim for rate control at this time -Consolidate Lopressor 25 mg bid, BP precludes further titration at this time -Echo with normal LV systolic function and wall motion  -CHADS2VASc at least 1 (HTN) -He would not need long term full dose anticoagulation at this time, as risks would out weigh benefits -Could have patient invest in Alive Cor to assess for further episodes of Afib as well -Urine drug screen pending  -TSH pending -Mg++ ok -He is obese which is likely playing a role in this as well, cannot rule out OSA/OHS which may be playing a role as well. He reports possible snoring. Recommend outpatient sleep study  2. HTN: -Controlled -Continue Lopressor 25 mg bid as above  3. Obesity/possible OSA/OHS: -As above -Needs outpatient sleep study  4. Tobacco abuse: ->15 minutes spent with patient discussing the risks of tobacco abuse, risks of E cig's, unknown long term effects of E cig's, and treatment options  -He will consider starting Chantix -Perhaps he will discuss this with his PCP   Signed, Eula Listen, PA-C Pager: [phone] 12/24/2015, 10:22 AM

## 2015-12-31 ENCOUNTER — Other Ambulatory Visit (INDEPENDENT_AMBULATORY_CARE_PROVIDER_SITE_OTHER): Payer: Managed Care, Other (non HMO)

## 2015-12-31 ENCOUNTER — Other Ambulatory Visit: Payer: Self-pay | Admitting: Family Medicine

## 2015-12-31 DIAGNOSIS — Z1211 Encounter for screening for malignant neoplasm of colon: Secondary | ICD-10-CM | POA: Diagnosis not present

## 2015-12-31 LAB — FECAL OCCULT BLOOD, GUAIAC: FECAL OCCULT BLD: NEGATIVE

## 2015-12-31 LAB — FECAL OCCULT BLOOD, IMMUNOCHEMICAL: Fecal Occult Bld: NEGATIVE

## 2016-01-01 ENCOUNTER — Encounter: Payer: Self-pay | Admitting: *Deleted

## 2016-01-07 ENCOUNTER — Encounter: Payer: Self-pay | Admitting: Nurse Practitioner

## 2016-01-07 ENCOUNTER — Encounter: Payer: Self-pay | Admitting: Family Medicine

## 2016-01-07 ENCOUNTER — Ambulatory Visit (INDEPENDENT_AMBULATORY_CARE_PROVIDER_SITE_OTHER): Payer: Managed Care, Other (non HMO) | Admitting: Family Medicine

## 2016-01-07 ENCOUNTER — Ambulatory Visit (INDEPENDENT_AMBULATORY_CARE_PROVIDER_SITE_OTHER): Payer: Managed Care, Other (non HMO) | Admitting: Nurse Practitioner

## 2016-01-07 VITALS — BP 118/78 | HR 57 | Ht 71.0 in | Wt 298.8 lb

## 2016-01-07 VITALS — BP 118/74 | HR 68 | Temp 98.0°F | Wt 302.5 lb

## 2016-01-07 DIAGNOSIS — I480 Paroxysmal atrial fibrillation: Secondary | ICD-10-CM

## 2016-01-07 DIAGNOSIS — F1210 Cannabis abuse, uncomplicated: Secondary | ICD-10-CM

## 2016-01-07 DIAGNOSIS — R079 Chest pain, unspecified: Secondary | ICD-10-CM | POA: Insufficient documentation

## 2016-01-07 DIAGNOSIS — R072 Precordial pain: Secondary | ICD-10-CM

## 2016-01-07 DIAGNOSIS — F1290 Cannabis use, unspecified, uncomplicated: Secondary | ICD-10-CM

## 2016-01-07 DIAGNOSIS — I10 Essential (primary) hypertension: Secondary | ICD-10-CM | POA: Diagnosis not present

## 2016-01-07 DIAGNOSIS — R400 Somnolence: Secondary | ICD-10-CM

## 2016-01-07 DIAGNOSIS — G4710 Hypersomnia, unspecified: Secondary | ICD-10-CM

## 2016-01-07 NOTE — Progress Notes (Signed)
Office Visit    Patient Name: Eric Salazar Date of Encounter: 01/07/2016  Primary Care Provider:  Eustaquio Boyden, MD Primary Cardiologist:  Concha Se, MD   Chief Complaint    55 year old male with recent diagnosis of paroxysmal atrial fibrillation who presents for follow-up today.  Past Medical History    Past Medical History  Diagnosis Date  . History of tobacco abuse     a.Quit 2016.  . Morbid obesity (HCC)   . Prediabetes   . Paroxysmal atrial fibrillation (HCC)     a. 12/2015 Echo: EF 60-65%, no rwma; b. Converted on oral BB; c. CHA2DS2VASc = 0-->on ASA 81.  . Precordial chest pain     a. 12/2015 in setting of AF RVR;  b. 12/2015 MV: low risk, EF 55-65%.   Past Surgical History  Procedure Laterality Date  . Mri lumbar  2008    disc protrusion L4/5 with impingement L5 and probably L4 nerve roots  . Dobutamine stress echo  01/2011    for typical/atypical chest pain - good tolerance, 11 METS, no significant abnormalities  . Cardiovascular stress test  12/2015    low risk study, EF 55-65%   Allergies  No Known Allergies  History of Present Illness    55 year old male with the above past medical history. He says he has a history of borderline hypertension but has never been on medicine for it in the past. He previously smoked cigarettes but says that he quit in December. He also used to drink alcohol but says he has not in some time. In early January, he was admitted to [facility] with complaints of chest pain and dyspnea. He was found to be in atrial fibrillation with rapid ventricular response. He was admitted and ruled out for myocardial infarction. He was easily rate controlled with beta blocker therapy. Echocardiogram showed normal LV function. Stress testing was nonischemic. Fortunately, he converted to sinus rhythm and was subsequently discharged home on oral beta blocker. He was not placed on oral anticoagulation given low stroke risk.  Since discharge, he  has had no recurrence of pain, dyspnea, or palpitations. He has been compliant with his medications. He has been trying to walk 1-2 miles daily in the mornings and realizes that he will need to lose weight in an effort to prevent future atrial fibrillation. He also is concerned about possible sleep apnea and is interested in a sleep study. He denies PND, orthopnea, dizziness, syncope, edema, or early satiety.  Home Medications    Prior to Admission medications   Medication Sig Start Date End Date Taking? Authorizing Provider  aspirin EC 81 MG tablet Take 81 mg by mouth daily.   Yes Historical Provider, MD  metoprolol tartrate (LOPRESSOR) 25 MG tablet Take 1 tablet (25 mg total) by mouth 2 (two) times daily. 12/24/15  Yes Shaune Pollack, MD  Multiple Vitamin (MULTIVITAMIN WITH MINERALS) TABS tablet Take 1 tablet by mouth daily.   Yes Historical Provider, MD  vitamin C (ASCORBIC ACID) 500 MG tablet Take 500 mg by mouth daily.     Yes Historical Provider, MD    Review of Systems    Doing well as above.  He denies chest pain, palpitations, dyspnea, pnd, orthopnea, n, v, dizziness, syncope, edema, weight gain, or early satiety.  All other systems reviewed and are otherwise negative except as noted above.  Physical Exam    VS:  BP 118/78 mmHg  Pulse 57  Ht  (1.803 m)  Wt 298 lb 12 oz (135.512 kg)  BMI 41.69 kg/m2 , BMI Body mass index is 41.69 kg/(m^2). GEN: Well nourished, well developed, in no acute distress. HEENT: normal. Neck: Supple, no JVD, carotid bruits, or masses. Cardiac: RRR, no murmurs, rubs, or gallops. No clubbing, cyanosis, edema.  Radials/DP/PT 2+ and equal bilaterally.  Respiratory:  Respirations regular and unlabored, clear to auscultation bilaterally. GI: Soft, nontender, nondistended, BS + x 4. MS: no deformity or atrophy. Skin: warm and dry, no rash. Neuro:  Strength and sensation are intact. Psych: Normal affect.  Accessory Clinical Findings    ECG - sinus  bradycardia, 57, no acute ST or T changes.  Assessment & Plan    1.  Paroxysmal atrial fibrillation: Patient was recently admitted with chest pain and dyspnea and found to be in A. fib. He converted on oral beta blocker therapy. Echo was normal and Myoview was nonischemic. His CHA2DS2VASc is 0 and he is not currently on oral anticoagulation. We did discuss risk factor modification including the importance of weight loss and evaluation for sleep apnea. We will refer for sleep study.  2. Morbid obesity: We discussed the importance of weight loss in preventing future atrial fibrillation. Patient has been walking about a mile to 2 each morning. I encouraged him to continue to do this and also to try and reduce his caloric intake.  3. Precordial chest pain: This occurred in the setting of rapid atrial fibrillation. Cardiac markers were negative and follow-up stress testing was also nonischemic. He has not had any additional chest discomfort. No further workup is required at this time.  4. History of tobacco abuse: Patient says he quit December 2016. Continued cessation advised.  5. Disposition: Follow-up with Dr. Mariah Milling in 6 months or sooner if necessary.  Nicolasa Ducking, NP 01/07/2016, 4:43 PM

## 2016-01-07 NOTE — Assessment & Plan Note (Signed)
Encouraged continued walking daily for goal weight loss.

## 2016-01-07 NOTE — Patient Instructions (Addendum)
Keep checking blood pressure at home. Bring blood pressure cuff next visit.  We will refer you for sleep study - pass by Marion's office to schedule this.Marland Kitchen  Keep daily walking routine for goal weight loss. Continue to avoid sodium in the diet.

## 2016-01-07 NOTE — Assessment & Plan Note (Signed)
Chronic, stable. Continue metoprolol  bid. Advised start monitoring bp at home and bring cuff to next visit to compare to our readings. Continue daily walking.

## 2016-01-07 NOTE — Progress Notes (Signed)
Pre visit review using our clinic review tool, if applicable. No additional management support is needed unless otherwise documented below in the visit note. 

## 2016-01-07 NOTE — Progress Notes (Signed)
BP 118/74 mmHg  Pulse 68  Temp(Src) 98 F (36.7 C) (Oral)  Wt 302 lb 8 oz (137.213 kg)   CC: [facility] f/u visit  Subjective:    Patient ID: Eric Salazar, male    DOB: July 05, 1961, 55 y.o.   MRN: [id number]  HPI: Eric Salazar is a 55 y.o. male presenting on 01/07/2016 for Follow-up   Transitional care appt scheduled for today with cardiology Ward Givens in [place].  Here for hosp f/u visit after recent hospitalization at [facility] from 1/18-19/2017 for dyspnea with chest pain. Found to have new onset atrial fibrillation, seen by cardiology. Metoprolol  bid started and afib converted to NSR. Echocardiogram (mild concentric hypertrophy) and CXR were normal. Stress test - T wave inversion during stress with medium defect of mild severity present mid anterior and apical anterior location thought due to artifact - normal, low risk study. EF 55-65%. Thyroid CBC, BMP normal.   Leading up to hospitalization, he had several day history of intermittent dyspnea that acutely worsened on day of admission.   ?OSA contribution. Sleeps in different room than wife. Unsure snoring, apnea. No PNdyspnea. Bedtime 7-8pm, wakes up 4am. Does feel rested when awakening. 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  MJ returned positive in hospital.   Brings log of blood pressures 130-170/70-90s (prior to hospitalization). Has not checked since home from hospital.   Healthy diet changes - has stopped alcohol. Avoiding sodium in the diet. Trying to increase walking - 1.5 mi/day.   Relevant past medical, surgical, family and social history reviewed and updated as indicated. Interim medical history since our last visit reviewed. Allergies and medications reviewed and updated. Current Outpatient Prescriptions on File Prior to Visit  Medication Sig  . aspirin EC 81 MG tablet Take 81 mg by mouth daily.  . metoprolol tartrate (LOPRESSOR) 25 MG tablet Take 1 tablet (25 mg  total) by mouth 2 (two) times daily.  . Multiple Vitamin (MULTIVITAMIN WITH MINERALS) TABS tablet Take 1 tablet by mouth daily.  . vitamin C (ASCORBIC ACID) 500 MG tablet Take 500 mg by mouth daily.     No current facility-administered medications on file prior to visit.    Review of Systems Per HPI unless specifically indicated in ROS section     Objective:    BP 118/74 mmHg  Pulse 68  Temp(Src) 98 F (36.7 C) (Oral)  Wt 302 lb 8 oz (137.213 kg)  Wt Readings from Last 3 Encounters:  01/07/16 302 lb 8 oz (137.213 kg)  12/23/15 289 lb 4.8 oz (131.226 kg)  11/20/15 304 lb (137.893 kg)   Body mass index is 42.21 kg/(m^2).  Physical Exam  Constitutional: He appears well-developed and well-nourished. No distress.  Morbidly obese  HENT:  Head: Normocephalic and atraumatic.  Mouth/Throat: Oropharynx is clear and moist. No oropharyngeal exudate.  mallampati 3-4  Cardiovascular: Normal rate, regular rhythm, normal heart sounds and intact distal pulses.   No murmur heard. Pulmonary/Chest: Effort normal and breath sounds normal. No respiratory distress. He has no wheezes. He has no rales.  Musculoskeletal: He exhibits no edema.  Skin: Skin is warm and dry. No rash noted.  Psychiatric: He has a normal mood and affect.  Nursing note and vitals reviewed.     Assessment & Plan:   Problem List Items Addressed This Visit    Paroxysmal atrial fibrillation (HCC) - Primary    Converted to NSR with beta blockade. Off anticoagulant, on aspirin   daily. Has f/u with cardiology this afternoon. EKG not performed.       Relevant Orders   Ambulatory referral to Pulmonology   Obesity, Class III, BMI 40-49.9 (morbid obesity) (HCC)    Encouraged continued walking daily for goal weight loss.      Relevant Orders   Ambulatory referral to Pulmonology   Marijuana use    Discussed positive UDS at ER - discussed possible health consequences of continued use and encouraged abstinence from  this.      Essential hypertension    Chronic, stable. Continue metoprolol  bid. Advised start monitoring bp at home and bring cuff to next visit to compare to our readings. Continue daily walking.        Other Visit Diagnoses    Daytime somnolence        Relevant Orders    Ambulatory referral to Pulmonology        Follow up plan: Return in about 6 months (around 07/06/2016), or if symptoms worsen or fail to improve, for follow up visit.

## 2016-01-07 NOTE — Assessment & Plan Note (Signed)
Discussed positive UDS at ER - discussed possible health consequences of continued use and encouraged abstinence from this.

## 2016-01-07 NOTE — Patient Instructions (Addendum)
Medication Instructions:  Please continue your current medications  Labwork: None  Testing/Procedures: None  Referrals: Dr. Sharen Hones placed a referral to pulmonology for sleep study eval We can schedule that appt for you here  Date & time: _____________________________  Follow-Up: Your physician wants you to follow-up in: 6 months w/ Dr. Billey Co will receive a reminder letter in the mail two months in advance.  If you don't receive a letter, please call our office to schedule the follow-up appointment.  If you need a refill on your cardiac medications before your next appointment, please call your pharmacy.  Atrial Fibrillation Atrial fibrillation is a type of irregular or rapid heartbeat (arrhythmia). In atrial fibrillation, the heart quivers continuously in a chaotic pattern. This occurs when parts of the heart receive disorganized signals that make the heart unable to pump blood normally. This can increase the risk for stroke, heart failure, and other heart-related conditions. There are different types of atrial fibrillation, including:  Paroxysmal atrial fibrillation. This type starts suddenly, and it usually stops on its own shortly after it starts.  Persistent atrial fibrillation. This type often lasts longer than a week. It may stop on its own or with treatment.  Long-lasting persistent atrial fibrillation. This type lasts longer than 12 months.  Permanent atrial fibrillation. This type does not go away. Talk with your health care provider to learn about the type of atrial fibrillation that you have. CAUSES This condition is caused by some heart-related conditions or procedures, including:  A heart attack.  Coronary artery disease.  Heart failure.  Heart valve conditions.  High blood pressure.  Inflammation of the sac that surrounds the heart (pericarditis).  Heart surgery.  Certain heart rhythm disorders, such as Wolf-Parkinson-White syndrome. Other  causes include:  Pneumonia.  Obstructive sleep apnea.  Blockage of an artery in the lungs (pulmonary embolism, or PE).  Lung cancer.  Chronic lung disease.  Thyroid problems, especially if the thyroid is overactive (hyperthyroidism).  Caffeine.  Excessive alcohol use or illegal drug use.  Use of some medicines, including certain decongestants and diet pills. Sometimes, the cause cannot be found. RISK FACTORS This condition is more likely to develop in:  People who are older in age.  People who smoke.  People who have diabetes mellitus.  People who are overweight (obese).  Athletes who exercise vigorously. SYMPTOMS Symptoms of this condition include:  A feeling that your heart is beating rapidly or irregularly.  A feeling of discomfort or pain in your chest.  Shortness of breath.  Sudden light-headedness or weakness.  Getting tired easily during exercise. In some cases, there are no symptoms. DIAGNOSIS Your health care provider may be able to detect atrial fibrillation when taking your pulse. If detected, this condition may be diagnosed with:  An electrocardiogram (ECG).  A Holter monitor test that records your heartbeat patterns over a 24-hour period.  Transthoracic echocardiogram (TTE) to evaluate how blood flows through your heart.  Transesophageal echocardiogram (TEE) to view more detailed images of your heart.  A stress test.  Imaging tests, such as a CT scan or chest X-ray.  Blood tests. TREATMENT The main goals of treatment are to prevent blood clots from forming and to keep your heart beating at a normal rate and rhythm. The type of treatment that you receive depends on many factors, such as your underlying medical conditions and how you feel when you are experiencing atrial fibrillation. This condition may be treated with:  Medicine to slow down the  heart rate, bring the heart's rhythm back to normal, or prevent clots from  forming.  Electrical cardioversion. This is a procedure that resets your heart's rhythm by delivering a controlled, low-energy shock to the heart through your skin.  Different types of ablation, such as catheter ablation, catheter ablation with pacemaker, or surgical ablation. These procedures destroy the heart tissues that send abnormal signals. When the pacemaker is used, it is placed under your skin to help your heart beat in a regular rhythm. HOME CARE INSTRUCTIONS  Take over-the counter and prescription medicines only as told by your health care provider.  If your health care provider prescribed a blood-thinning medicine (anticoagulant), take it exactly as told. Taking too much blood-thinning medicine can cause bleeding. If you do not take enough blood-thinning medicine, you will not have the protection that you need against stroke and other problems.  Do not use tobacco products, including cigarettes, chewing tobacco, and e-cigarettes. If you need help quitting, ask your health care provider.  If you have obstructive sleep apnea, manage your condition as told by your health care provider.  Do not drink alcohol.  Do not drink beverages that contain caffeine, such as coffee, soda, and tea.  Maintain a healthy weight. Do not use diet pills unless your health care provider approves. Diet pills may make heart problems worse.  Follow diet instructions as told by your health care provider.  Exercise regularly as told by your health care provider.  Keep all follow-up visits as told by your health care provider. This is important. PREVENTION  Avoid drinking beverages that contain caffeine or alcohol.  Avoid certain medicines, especially medicines that are used for breathing problems.  Avoid certain herbs and herbal medicines, such as those that contain ephedra or ginseng.  Do not use illegal drugs, such as cocaine and amphetamines.  Do not smoke.  Manage your high blood  pressure. SEEK MEDICAL CARE IF:  You notice a change in the rate, rhythm, or strength of your heartbeat.  You are taking an anticoagulant and you notice increased bruising.  You tire more easily when you exercise or exert yourself. SEEK IMMEDIATE MEDICAL CARE IF:  You have chest pain, abdominal pain, sweating, or weakness.  You feel nauseous.  You notice blood in your vomit, bowel movement, or urine.  You have shortness of breath.  You suddenly have swollen feet and ankles.  You feel dizzy.  You have sudden weakness or numbness of the face, arm, or leg, especially on one side of the body.  You have trouble speaking, trouble understanding, or both (aphasia).  Your face or your eyelid droops on one side. These symptoms may represent a serious problem that is an emergency. Do not wait to see if the symptoms will go away. Get medical help right away. Call your local emergency services (911 in the [place] Do not drive yourself to the hospital.   This information is not intended to replace advice given to you by your health care provider. Make sure you discuss any questions you have with your health care provider.   Document Released: 11/21/2005 Document Revised: 08/12/2015 Document Reviewed: 03/18/2015 Elsevier Interactive Patient Education [employer]

## 2016-01-07 NOTE — Assessment & Plan Note (Addendum)
Converted to NSR with beta blockade. Off anticoagulant, on aspirin  daily. Has f/u with cardiology this afternoon. EKG not performed.

## 2016-01-12 ENCOUNTER — Encounter: Payer: Self-pay | Admitting: Family Medicine

## 2016-01-14 MED ORDER — METOPROLOL TARTRATE 25 MG PO TABS: 25.0000 mg | ORAL_TABLET | Freq: Two times a day (BID) | ORAL | Status: DC

## 2016-03-01 ENCOUNTER — Ambulatory Visit (INDEPENDENT_AMBULATORY_CARE_PROVIDER_SITE_OTHER): Payer: Managed Care, Other (non HMO) | Admitting: Internal Medicine

## 2016-03-01 ENCOUNTER — Encounter: Payer: Self-pay | Admitting: Internal Medicine

## 2016-03-01 VITALS — BP 130/76 | HR 71 | Ht 70.0 in | Wt 297.4 lb

## 2016-03-01 DIAGNOSIS — G479 Sleep disorder, unspecified: Secondary | ICD-10-CM

## 2016-03-01 DIAGNOSIS — G4719 Other hypersomnia: Secondary | ICD-10-CM | POA: Diagnosis not present

## 2016-03-01 NOTE — Progress Notes (Signed)
[facility] [place] [facility] Pul[facility] Medicine Consultation      Date: 03/01/2016,   MRN# [id number] Rachael FeeMichael W Wos 04-28-1961 Code Status:  Code Status History    Date Active Date Inactive Code Status Order ID Comments User Context   12/23/2015  3:39 PM 12/24/2015  6:32 PM Full Code [id number]  Alford Highlandichard Wieting, MD ED     Hosp day:@LENGTHOFSTAYDAYS @ Referring MD: @ATDPROV @     PCP:      AdmissionWeight: 297 lb 6.4 oz (134.9 kg)                 CurrentWeight: 297 lb 6.4 oz (134.9 kg) Rachael FeeMichael W Tugwell is a 55 y.o. old male seen in consultation for sleep Apnea at the request of Dr. Mariah MillingGollan     CHIEF COMPLAINT:  Sleep problems with high blood pressure    HISTORY OF PRESENT ILLNESS   55 yo AAM seen today for sleep issues that's been going on for several years. Patient has been dx with HTN and also acute afib with RVR several months ago, had seen cardiology  And would like patient to be evaluated for sleep apnea  He has occasional daytime sleepiness, excessive fatigue by mid day. He gets about 6-7 hrs of sleep at night, shirt collar size greater than 17 inches Patient states that he has restless sleep  Patient has gained and lost weight over the past year current weight approx 300 pounds Patient is former smoker 1 ppd for 30 yeas quit 2 months ago Patient also quit ETOH abuse No signs of infection at this time   PAST MEDICAL HISTORY   Past Medical History  Diagnosis Date  . History of tobacco abuse     a.Quit 2016.  . Morbid obesity (HCC)   . Prediabetes   . Paroxysmal atrial fibrillation (HCC)     a. 12/2015 Echo: EF 60-65%, no rwma; b. Converted on oral BB; c. CHA2DS2VASc = 0-->on ASA 81.  . Precordial chest pain     a. 12/2015 in setting of AF RVR;  b. 12/2015 MV: low risk, EF 55-65%.     SURGICAL HISTORY   Past Surgical History  Procedure Laterality Date  . Mri lumbar  2008    disc protrusion L4/5 with impingement L5 and probably L4 nerve roots  . Dobutamine stress echo   01/2011    for typical/atypical chest pain - good tolerance, 11 METS, no significant abnormalities  . Cardiovascular stress test  12/2015    low risk study, EF 55-65%     FAMILY HISTORY   Family History  Problem Relation Age of Onset  . Hypertension Mother   . Diabetes Mother     prediabetes  . Coronary artery disease Mother [id number]    MI  . Heart attack Mother   . Hypertension Father   . Cancer Neg Hx      SOCIAL HISTORY   Social History  Substance Use Topics  . Smoking status: Former Smoker    Types: Cigarettes    Quit date: 12/21/2015  . Smokeless tobacco: Never Used     Comment: varies  . Alcohol Use: 1.8 oz/week    3 Shots of liquor, 0 Standard drinks or equivalent per week     Comment: occasionally     MEDICATIONS    Home Medication:  Current Outpatient Rx  Name  Route  Sig  Dispense  Refill  . aspirin EC 81 MG tablet   Oral   Take 81 mg by mouth daily.         .Marland Kitchen  metoprolol tartrate (LOPRESSOR) 25 MG tablet   Oral   Take 1 tablet (25 mg total) by mouth 2 (two) times daily.   60 tablet   11   . Multiple Vitamin (MULTIVITAMIN WITH MINERALS) TABS tablet   Oral   Take 1 tablet by mouth daily.         . vitamin C (ASCORBIC ACID) 500 MG tablet   Oral   Take 500 mg by mouth daily.             Current Medication:  Current outpatient prescriptions:  .  aspirin EC 81 MG tablet, Take 81 mg by mouth daily., Disp: , Rfl:  .  metoprolol tartrate (LOPRESSOR) 25 MG tablet, Take 1 tablet (25 mg total) by mouth 2 (two) times daily., Disp: 60 tablet, Rfl: 11 .  Multiple Vitamin (MULTIVITAMIN WITH MINERALS) TABS tablet, Take 1 tablet by mouth daily., Disp: , Rfl:  .  vitamin C (ASCORBIC ACID) 500 MG tablet, Take 500 mg by mouth daily.  , Disp: , Rfl:     ALLERGIES   Review of patient's allergies indicates no known allergies.     REVIEW OF SYSTEMS   Review of Systems  Constitutional: Positive for malaise/fatigue. Negative for fever, chills, weight  loss and diaphoresis.  HENT: Negative for congestion and hearing loss.   Eyes: Negative for blurred vision and double vision.  Respiratory: Negative for cough, sputum production, shortness of breath and wheezing.   Cardiovascular: Negative for chest pain, palpitations and orthopnea.  Gastrointestinal: Negative for heartburn, nausea, vomiting, abdominal pain, diarrhea, constipation and blood in stool.  Genitourinary: Negative for dysuria and urgency.  Musculoskeletal: Negative for myalgias, back pain and neck pain.  Skin: Negative for rash.  Neurological: Negative for dizziness, tingling, tremors, weakness and headaches.  Endo/Heme/Allergies: Does not bruise/bleed easily.  Psychiatric/Behavioral: Negative for depression, suicidal ideas and substance abuse.  All other systems reviewed and are negative.    VS: BP 130/76 mmHg  Pulse 71  Ht  (1.778 m)  Wt 297 lb 6.4 oz (134.9 kg)  BMI 42.67 kg/m2  SpO2 97%     PHYSICAL EXAM  Physical Exam  Constitutional: He is oriented to person, place, and time. He appears well-developed and well-nourished. No distress.  HENT:  Head: Normocephalic and atraumatic.  Mouth/Throat: No oropharyngeal exudate.  Eyes: EOM are normal. Pupils are equal, round, and reactive to light. No scleral icterus.  Neck: Normal range of motion. Neck supple.  Cardiovascular: Normal rate, regular rhythm and normal heart sounds.   No murmur heard. Pulmonary/Chest: No stridor. No respiratory distress. He has no wheezes. He has no rales.  Abdominal: Soft. Bowel sounds are normal. He exhibits no distension.  Musculoskeletal: Normal range of motion. He exhibits no edema.  Neurological: He is alert and oriented to person, place, and time. Coordination normal.  Skin: Skin is warm. He is not diaphoretic. No erythema.  Psychiatric: He has a normal mood and affect.        12/2015 ECHO EF 60%        IMAGING   CXR on 12/23/2015  images reviewed  03/01/2016     ASSESSMENT/PLAN   55 yo obese AAM with sleep issues with excessive daytime somnelence and fatigue with probable underlying OSA with dx of recent afib with HTN  Patient will need SLeep Study for definitive dx  1.Will follow up after results completed 2.recommend diet and exercise 3.follow up with cardiology as scheduled   I have personally obtained a  history, examined the patient, evaluated laboratory and independently reviewed imaging results, formulated the assessment and plan and placed orders.  The Patient requires high complexity decision making for assessment and support, frequent evaluation and titration of therapies, application of advanced monitoring technologies and extensive interpretation of multiple databases.   Patient satisfied with Plan of action and management. All questions answered  Lucie Leather, M.D.  [facility]  Medical Director [facility] Medical Director [facility] Cardio-Pulmonary Department

## 2016-03-01 NOTE — Patient Instructions (Signed)
Sleep Apnea  Sleep apnea is a sleep disorder characterized by abnormal pauses in breathing while you sleep. When your breathing pauses, the level of oxygen in your blood decreases. This causes you to move out of deep sleep and into light sleep. As a result, your quality of sleep is poor, and the system that carries your blood throughout your body (cardiovascular system) experiences stress. If sleep apnea remains untreated, the following conditions can develop:  High blood pressure (hypertension).  Coronary artery disease.  Inability to achieve or maintain an erection (impotence).  Impairment of your thought process (cognitive dysfunction). There are three types of sleep apnea: 1. Obstructive sleep apnea--Pauses in breathing during sleep because of a blocked airway. 2. Central sleep apnea--Pauses in breathing during sleep because the area of the brain that controls your breathing does not send the correct signals to the muscles that control breathing. 3. Mixed sleep apnea--A combination of both obstructive and central sleep apnea. RISK FACTORS The following risk factors can increase your risk of developing sleep apnea:  Being overweight.  Smoking.  Having narrow passages in your nose and throat.  Being of older age.  Being male.  Alcohol use.  Sedative and tranquilizer use.  Ethnicity. Among individuals younger than 35 years, African Americans are at increased risk of sleep apnea. SYMPTOMS   Difficulty staying asleep.  Daytime sleepiness and fatigue.  Loss of energy.  Irritability.  Loud, heavy snoring.  Morning headaches.  Trouble concentrating.  Forgetfulness.  Decreased interest in sex.  Unexplained sleepiness. DIAGNOSIS  In order to diagnose sleep apnea, your caregiver will perform a physical examination. A sleep study done in the comfort of your own home may be appropriate if you are otherwise healthy. Your caregiver may also recommend that you spend the  night in a sleep lab. In the sleep lab, several monitors record information about your heart, lungs, and brain while you sleep. Your leg and arm movements and blood oxygen level are also recorded. TREATMENT The following actions may help to resolve mild sleep apnea:  Sleeping on your side.   Using a decongestant if you have nasal congestion.   Avoiding the use of depressants, including alcohol, sedatives, and narcotics.   Losing weight and modifying your diet if you are overweight. There also are devices and treatments to help open your airway:  Oral appliances. These are custom-made mouthpieces that shift your lower jaw forward and slightly open your bite. This opens your airway.  Devices that create positive airway pressure. This positive pressure "splints" your airway open to help you breathe better during sleep. The following devices create positive airway pressure:  Continuous positive airway pressure (CPAP) device. The CPAP device creates a continuous level of air pressure with an air pump. The air is delivered to your airway through a mask while you sleep. This continuous pressure keeps your airway open.  Nasal expiratory positive airway pressure (EPAP) device. The EPAP device creates positive air pressure as you exhale. The device consists of single-use valves, which are inserted into each nostril and held in place by adhesive. The valves create very little resistance when you inhale but create much more resistance when you exhale. That increased resistance creates the positive airway pressure. This positive pressure while you exhale keeps your airway open, making it easier to breath when you inhale again.  Bilevel positive airway pressure (BPAP) device. The BPAP device is used mainly in patients with central sleep apnea. This device is similar to the CPAP device because   it also uses an air pump to deliver continuous air pressure through a mask. However, with the BPAP machine, the  pressure is set at two different levels. The pressure when you exhale is lower than the pressure when you inhale.  Surgery. Typically, surgery is only done if you cannot comply with less invasive treatments or if the less invasive treatments do not improve your condition. Surgery involves removing excess tissue in your airway to create a wider passage way.   This information is not intended to replace advice given to you by your health care provider. Make sure you discuss any questions you have with your health care provider.   Document Released: 11/11/2002 Document Revised: 12/12/2014 Document Reviewed: 03/29/2012 Elsevier Interactive Patient Education 2016 Elsevier Inc.   

## 2016-03-31 ENCOUNTER — Ambulatory Visit: Payer: Managed Care, Other (non HMO) | Admitting: Internal Medicine

## 2016-04-11 ENCOUNTER — Telehealth: Payer: Self-pay | Admitting: *Deleted

## 2016-04-11 DIAGNOSIS — G4719 Other hypersomnia: Secondary | ICD-10-CM

## 2016-04-11 NOTE — Telephone Encounter (Signed)
Insurance denied sleep lab. Must do HST. HST ordered.

## 2016-04-25 ENCOUNTER — Telehealth: Payer: Self-pay | Admitting: Internal Medicine

## 2016-04-25 DIAGNOSIS — G4719 Other hypersomnia: Secondary | ICD-10-CM

## 2016-04-25 NOTE — Telephone Encounter (Signed)
Pt calling stating his insurance company will only cover a in home sleep study test. Would like to know when and how we can help him with this.  Please advise

## 2016-04-25 NOTE — Telephone Encounter (Signed)
[facility] see if [facility] the [facility] to schedule.  Waiting on reply. Rhonda J Cobb

## 2016-04-25 NOTE — Telephone Encounter (Signed)
Order already placed for HST.

## 2016-04-25 NOTE — Telephone Encounter (Signed)
Contacted patient and advised that I did obtain prior authorization for his HST. [facility] and that they process order by date order.  Advised patient that he would probably be getting a call either this week or next to schedule set up. Pt stated that was fine.  Spoke with Syn[facility] and she stated that she will contact him to arrange. Rhonda J Cobb

## 2016-05-15 DIAGNOSIS — G4733 Obstructive sleep apnea (adult) (pediatric): Secondary | ICD-10-CM | POA: Diagnosis not present

## 2016-05-17 ENCOUNTER — Other Ambulatory Visit: Payer: Self-pay | Admitting: *Deleted

## 2016-05-17 DIAGNOSIS — G4733 Obstructive sleep apnea (adult) (pediatric): Secondary | ICD-10-CM | POA: Diagnosis not present

## 2016-05-17 DIAGNOSIS — G4719 Other hypersomnia: Secondary | ICD-10-CM

## 2016-06-13 ENCOUNTER — Telehealth: Payer: Self-pay | Admitting: Internal Medicine

## 2016-06-13 NOTE — Telephone Encounter (Signed)
Report mailed per pt request. Nothing further needed.

## 2016-06-13 NOTE — Telephone Encounter (Signed)
Pt would like us to please mail him the full report of his sleep study. He went to MyChart and is not able to print it at home. Please advise.

## 2016-06-20 ENCOUNTER — Telehealth: Payer: Self-pay | Admitting: Internal Medicine

## 2016-06-20 NOTE — Telephone Encounter (Signed)
Pt states he would like a copy of his sleep study results. Please call.

## 2016-06-20 NOTE — Telephone Encounter (Signed)
Called and spoke with patient and he is requesting a copy of his home sleep study.  Study printed and patient requested that I mail the sleep study to his home address.  Home address verified with patient and study mailed to patient per his request. Eric Salazar

## 2016-07-08 ENCOUNTER — Ambulatory Visit: Payer: Managed Care, Other (non HMO) | Admitting: Cardiovascular Disease

## 2016-07-22 ENCOUNTER — Encounter: Payer: Self-pay | Admitting: Cardiovascular Disease

## 2016-07-22 ENCOUNTER — Ambulatory Visit (INDEPENDENT_AMBULATORY_CARE_PROVIDER_SITE_OTHER): Payer: Managed Care, Other (non HMO) | Admitting: Cardiovascular Disease

## 2016-07-22 VITALS — BP 120/80 | HR 65 | Ht 71.0 in | Wt 296.5 lb

## 2016-07-22 DIAGNOSIS — Z87891 Personal history of nicotine dependence: Secondary | ICD-10-CM

## 2016-07-22 DIAGNOSIS — I480 Paroxysmal atrial fibrillation: Secondary | ICD-10-CM

## 2016-07-22 DIAGNOSIS — G4730 Sleep apnea, unspecified: Secondary | ICD-10-CM

## 2016-07-22 DIAGNOSIS — I10 Essential (primary) hypertension: Secondary | ICD-10-CM | POA: Diagnosis not present

## 2016-07-22 NOTE — Patient Instructions (Signed)
Medication Instructions:   No medication changes Please take extra metoprolol if you develop atrial fibrillation  Labwork:  No labs needed  Testing/Procedures:  No testing needed  Follow-Up: It was a pleasure seeing you in the office today. Please call us if you have new issues that need to be addressed before your next appt.  [phone]  Your physician wants you to follow-up in: 12 months.  You will receive a reminder letter in the mail two months in advance. If you don't receive a letter, please call our office to schedule the follow-up appointment.  If you need a refill on your cardiac medications before your next appointment, please call your pharmacy.

## 2016-07-22 NOTE — Progress Notes (Signed)
Cardiology Office Note  Date:  07/22/2016   ID:  Eric Salazar, DOB 1961-06-15, MRN [id number]  PCP:  Eustaquio BoydenJavier Gutierrez, MD   Chief Complaint  Patient presents with  . Other    6 month follow up. Meds reviewed by the patient verbally. "doing well."    HPI:  55 year old male with hypertension , Prior smoking history, previous heavy alcohol use per the notes  admitted to the hospital January 2017 with chest pain and shortness of breath, found to be in atrial fibrillation with RVR, initial rate control with beta blockers, converting to normal sinus rhythm. He was not placed on anticoagulation. He presents today for follow-up of his paroxysmal atrial fibrillation  He denies any episodes of chest pain or shortness of breath concerning for arrhythmia. No regular exercise program. Denies any palpitations  He did have outpatient sleep study several months ago, received the report in the mail Results reviewed with him in detail showing 8 to 9  AHI, He does not seem take a interested in management of his mild sleep apnea, reports that he does not get daytime somnolence Works different shifts, sleep is always broken Typically gets up very early for work, sometimes takes a nap in the afternoon  EKG shows normal sinus rhythm with rate 68 bpm, no significant ST or T-wave changes  Other past medical history reviewed Echocardiogram showed normal LV function.  Stress testing was nonischemic.    PMH:   has a past medical history of History of tobacco abuse; Morbid obesity (HCC); Paroxysmal atrial fibrillation (HCC); Precordial chest pain; and Prediabetes.  PSH:    Past Surgical History:  Procedure Laterality Date  . CARDIOVASCULAR STRESS TEST  12/2015   low risk study, EF 55-65%  . DOBUTAMINE STRESS ECHO  01/2011   for typical/atypical chest pain - good tolerance, 11 METS, no significant abnormalities  . MRI lumbar  2008   disc protrusion L4/5 with impingement L5 and probably L4 nerve roots     Current Outpatient Prescriptions  Medication Sig Dispense Refill  . aspirin EC 81 MG tablet Take 81 mg by mouth daily.    . metoprolol tartrate (LOPRESSOR) 25 MG tablet Take 1 tablet (25 mg total) by mouth 2 (two) times daily. 60 tablet 11  . Multiple Vitamin (MULTIVITAMIN WITH MINERALS) TABS tablet Take 1 tablet by mouth daily.    . vitamin C (ASCORBIC ACID) 500 MG tablet Take 500 mg by mouth daily.       No current facility-administered medications for this visit.      Allergies:   Review of patient's allergies indicates no known allergies.   Social History:  The patient  reports that he quit smoking about 7 months ago. His smoking use included Cigarettes. He has never used smokeless tobacco. He reports that he drinks about 1.8 oz of alcohol per week . He reports that he does not use drugs.   Family History:   family history includes Coronary artery disease (age of onset: 2167) in his mother; Diabetes in his mother; Heart attack in his mother; Hypertension in his father and mother.    Review of Systems: Review of Systems  Constitutional: Negative.   Respiratory: Negative.   Cardiovascular: Negative.   Gastrointestinal: Negative.   Musculoskeletal: Negative.   Neurological: Negative.   Psychiatric/Behavioral: Negative.   All other systems reviewed and are negative.    PHYSICAL EXAM: VS:  BP 120/80 (BP Location: Left Arm, Patient Position: Sitting, Cuff Size: Large)   Pulse 65  Ht 5\' 11"  (1.803 m)   Wt 296 lb 8 oz (134.5 kg)   BMI 41.35 kg/m  , BMI Body mass index is 41.35 kg/m. GEN: Well nourished, well developed, in no acute distress , obese HEENT: normal  Neck: no JVD, carotid bruits, or masses Cardiac: RRR; no murmurs, rubs, or gallops,no edema  Respiratory:  clear to auscultation bilaterally, normal work of breathing GI: soft, nontender, nondistended, + BS MS: no deformity or atrophy  Skin: warm and dry, no rash Neuro:  Strength and sensation are  intact Psych: euthymic mood, full affect    Recent Labs: 12/23/2015: Magnesium 1.9; TSH 1.042 12/24/2015: BUN 16; Creatinine, Ser 1.19; Hemoglobin 14.9; Platelets 188; Potassium 4.5; Sodium 140    Lipid Panel Lab Results  Component Value Date   CHOL 179 11/18/2014   HDL 38.20 (L) 11/18/2014   LDLCALC 125 (H) 11/18/2014   TRIG 77.0 11/18/2014      Wt Readings from Last 3 Encounters:  07/22/16 296 lb 8 oz (134.5 kg)  03/01/16 297 lb 6.4 oz (134.9 kg)  01/07/16 298 lb 12 oz (135.5 kg)       ASSESSMENT AND PLAN:  1.  Paroxysmal atrial fibrillation:  Previously converted on oral beta blocker therapy.  Echo was normal and Myoview was nonischemic.  His CHA2DS2VASc is 0 and he is not currently on oral anticoagulation.  We did discuss risk factor modification including the importance of weight loss and treatment of his mild sleep apnea.  He is not interested in treatment of his CPAP at this time No medication changes at this time  2. Morbid obesity:  We discussed the importance of weight loss in preventing future atrial fibrillation.  Recommended he try and reduce his caloric intake.  3. Precordial chest pain:  This occurred in the setting of rapid atrial fibrillation.  No further episodes, previous stress testing was also nonischemic.   4. History of tobacco abuse:   quit December 2016. Continued cessation advised.  5. Mild sleep apnea Results reviewed with him in detail He is not interested in treatment of his CPAP at this time, reports that he is sleeping fine, not tired in the day   Total encounter time more than 25 minutes  Greater than 50% was spent in counseling and coordination of care with the patient   Disposition:   F/U  12 months  Orders Placed This Encounter  Procedures  . EKG 12-Lead     Signed, Dossie Arbourim Lola Czerwonka, M.D., Ph.D. 07/22/2016  [facility], [place] [phone]

## 2017-01-05 ENCOUNTER — Encounter: Payer: Self-pay | Admitting: Family Medicine

## 2017-01-05 ENCOUNTER — Ambulatory Visit (INDEPENDENT_AMBULATORY_CARE_PROVIDER_SITE_OTHER): Payer: 59 | Admitting: Family Medicine

## 2017-01-05 VITALS — BP 142/86 | HR 79 | Temp 98.9°F | Ht 71.0 in | Wt 309.4 lb

## 2017-01-05 DIAGNOSIS — I10 Essential (primary) hypertension: Secondary | ICD-10-CM

## 2017-01-05 DIAGNOSIS — R519 Headache, unspecified: Secondary | ICD-10-CM

## 2017-01-05 DIAGNOSIS — R51 Headache: Secondary | ICD-10-CM | POA: Diagnosis not present

## 2017-01-05 DIAGNOSIS — R7303 Prediabetes: Secondary | ICD-10-CM | POA: Diagnosis not present

## 2017-01-05 LAB — POCT GLUCOSE (DEVICE FOR HOME USE): POC Glucose: 95 mg/dl (ref 70–99)

## 2017-01-05 MED ORDER — FLUTICASONE PROPIONATE 50 MCG/ACT NA SUSP: 2.0000 | Freq: Every day | NASAL | 3 refills | Status: DC

## 2017-01-05 MED ORDER — METOPROLOL TARTRATE 37.5 MG PO TABS: 37.5000 mg | ORAL_TABLET | Freq: Two times a day (BID) | ORAL | 6 refills | Status: DC

## 2017-01-05 NOTE — Progress Notes (Signed)
Pre visit review using our clinic review tool, if applicable. No additional management support is needed unless otherwise documented below in the visit note. 

## 2017-01-05 NOTE — Assessment & Plan Note (Signed)
Chronic, elevated here in office and at home - will increase metoprolol to 37.5mg  bid, take 25mg  TID until he runs out (5 more days). Pt agrees with plan.

## 2017-01-05 NOTE — Assessment & Plan Note (Signed)
Check cbg per pt request. Will check a1c when he returns for CPE.

## 2017-01-05 NOTE — Assessment & Plan Note (Addendum)
Discussed noted weight gain.  

## 2017-01-05 NOTE — Assessment & Plan Note (Signed)
Anticipate sinus congestion leading to headache. rec avoid pseudophed which he has previously taken. Will treat with flonase nasal steroid. Reassess at CPE.

## 2017-01-05 NOTE — Patient Instructions (Addendum)
Fingerstick cbg today.  Increase metoprolol to 3 tablets daily until you run out then new dose will be 37.5mg  twice daily - sent to pharmacy. I think you have sinus congestion leading to headache. Treat with flonase nasal steroid once daily. May use nasal saline irrigation and drink lots of water.  Return at your convenience for physical.  Work on weight loss to help control sugar and blood pressures.

## 2017-01-05 NOTE — Progress Notes (Signed)
BP (!) 142/86   Pulse 79   Temp 98.9 F (37.2 C) (Oral)   Ht 5\' 11"  (1.803 m)   Wt (!) 309 lb 6.4 oz (140.3 kg)   SpO2 98%   BMI 43.15 kg/m    CC: ?sinus headache Subjective:    Patient ID: Eric Salazar, male    DOB: 10-Jul-1961, 56 y.o.   MRN: [id number]  HPI: Eric FeeMichael W Salazar is a 56 y.o. male presenting on 01/05/2017 for Headache (off and on 1 month--with some congestion--feels like a sinus headache at time--not sleeping well)   1 mo h/o sinus head congestion and frontal pressure. Off and on malaise recently as well. No significant allergic rhinitis. Mild intermittent dyspnea. No fevers/chills, ST, PNdrainage, cough, chest pain, ear or tooth pain. No h/o asthma.   Hasn't tried anything for this.   bp mildly elevated today. Endorses home readings somewhat elevated as well - averaging sbp 140s.   H/o parox atrial fibrillation s/p conversion with beta blocker.   Son hospitalized recently with mental health issues recently - increased stress.   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  Overdue for CPE.   Relevant past medical, surgical, family and social history reviewed and updated as indicated. Interim medical history since our last visit reviewed. Allergies and medications reviewed and updated. Current Outpatient Prescriptions on File Prior to Visit  Medication Sig  . aspirin EC 81 MG tablet Take 81 mg by mouth daily.  . Multiple Vitamin (MULTIVITAMIN WITH MINERALS) TABS tablet Take 1 tablet by mouth daily.  . vitamin C (ASCORBIC ACID) 500 MG tablet Take 500 mg by mouth daily.     No current facility-administered medications on file prior to visit.     Review of Systems Per HPI unless specifically indicated in ROS section     Objective:    BP (!) 142/86   Pulse 79   Temp 98.9 F (37.2 C) (Oral)   Ht 5\' 11"  (1.803 m)   Wt (!) 309 lb 6.4 oz (140.3 kg)   SpO2 98%   BMI 43.15 kg/m   Wt Readings from Last 3 Encounters:  01/05/17 (!) 309 lb 6.4 oz (140.3 kg)   07/22/16 296 lb 8 oz (134.5 kg)  03/01/16 297 lb 6.4 oz (134.9 kg)    Physical Exam  Constitutional: He appears well-developed and well-nourished. No distress.  HENT:  Head: Normocephalic and atraumatic.  Right Ear: Hearing, tympanic membrane, external ear and ear canal normal.  Left Ear: Hearing, tympanic membrane, external ear and ear canal normal.  Nose: Nose normal. No mucosal edema or rhinorrhea. Right sinus exhibits no maxillary sinus tenderness and no frontal sinus tenderness. Left sinus exhibits no maxillary sinus tenderness and no frontal sinus tenderness.  Mouth/Throat: Uvula is midline, oropharynx is clear and moist and mucous membranes are normal. No oropharyngeal exudate, posterior oropharyngeal edema, posterior oropharyngeal erythema or tonsillar abscesses.  Eyes: Conjunctivae and EOM are normal. Pupils are equal, round, and reactive to light. No scleral icterus.  Neck: Normal range of motion. Neck supple.  Cardiovascular: Normal rate, regular rhythm, normal heart sounds and intact distal pulses.   No murmur heard. Heart sounds regular today.   Pulmonary/Chest: Effort normal and breath sounds normal. No respiratory distress. He has no wheezes. He has no rales.  Lymphadenopathy:    He has no cervical adenopathy.  Skin: Skin is warm and dry. No rash noted.  Nursing note and vitals reviewed.  Results for orders placed or performed  in visit on 01/01/16  Fecal Occult Blood, Guaiac  Result Value Ref Range   Fecal Occult Blood Negative       Assessment & Plan:  Patient will return in 1-2 months for CPE as due.  Problem List Items Addressed This Visit    Essential hypertension    Chronic, elevated here in office and at home - will increase metoprolol to 37.5mg  bid, take 25mg  TID until he runs out (5 more days). Pt agrees with plan.       Relevant Medications   metoprolol tartrate 37.5 MG TABS   Obesity, Class III, BMI 40-49.9 (morbid obesity) (HCC)    Discussed noted  weight gain.       Prediabetes    Check cbg per pt request. Will check a1c when he returns for CPE.       Sinus headache - Primary    Anticipate sinus congestion leading to headache. rec avoid pseudophed which he has previously taken. Will treat with flonase nasal steroid. Reassess at CPE.       Relevant Medications   metoprolol tartrate 37.5 MG TABS       Follow up plan: Return in about 2 months (around 03/05/2017) for annual exam, prior fasting for blood work.  Eustaquio Boyden, MD

## 2017-01-06 NOTE — Addendum Note (Signed)
Addended by: Tawnya CrookSAMBATH, Tadan Shill on: 01/06/2017 07:17 AM   Modules accepted: Orders

## 2017-01-15 ENCOUNTER — Other Ambulatory Visit: Payer: Self-pay | Admitting: Family Medicine

## 2017-02-27 ENCOUNTER — Other Ambulatory Visit: Payer: Self-pay | Admitting: Family Medicine

## 2017-02-27 DIAGNOSIS — E785 Hyperlipidemia, unspecified: Secondary | ICD-10-CM

## 2017-02-27 DIAGNOSIS — Z1159 Encounter for screening for other viral diseases: Secondary | ICD-10-CM

## 2017-02-27 DIAGNOSIS — R7303 Prediabetes: Secondary | ICD-10-CM

## 2017-03-02 ENCOUNTER — Other Ambulatory Visit (INDEPENDENT_AMBULATORY_CARE_PROVIDER_SITE_OTHER): Payer: 59

## 2017-03-02 ENCOUNTER — Other Ambulatory Visit: Payer: 59

## 2017-03-02 DIAGNOSIS — R7303 Prediabetes: Secondary | ICD-10-CM

## 2017-03-02 DIAGNOSIS — E785 Hyperlipidemia, unspecified: Secondary | ICD-10-CM

## 2017-03-02 DIAGNOSIS — Z1159 Encounter for screening for other viral diseases: Secondary | ICD-10-CM | POA: Diagnosis not present

## 2017-03-02 LAB — COMPREHENSIVE METABOLIC PANEL WITH GFR
ALT: 46 U/L (ref 9–46)
AST: 30 U/L (ref 10–35)
Albumin: 3.7 g/dL (ref 3.6–5.1)
Alkaline Phosphatase: 86 U/L (ref 40–115)
BUN: 14 mg/dL (ref 7–25)
CO2: 28 mmol/L (ref 20–31)
Calcium: 9.3 mg/dL (ref 8.6–10.3)
Chloride: 101 mmol/L (ref 98–110)
Creat: 1.06 mg/dL (ref 0.70–1.33)
Glucose, Bld: 96 mg/dL (ref 65–99)
Potassium: 4.2 mmol/L (ref 3.5–5.3)
Sodium: 139 mmol/L (ref 135–146)
Total Bilirubin: 0.7 mg/dL (ref 0.2–1.2)
Total Protein: 6.9 g/dL (ref 6.1–8.1)

## 2017-03-02 LAB — LIPID PANEL
Cholesterol: 200 mg/dL — ABNORMAL HIGH
HDL: 49 mg/dL
LDL Cholesterol: 128 mg/dL — ABNORMAL HIGH
Total CHOL/HDL Ratio: 4.1 ratio
Triglycerides: 115 mg/dL
VLDL: 23 mg/dL

## 2017-03-02 LAB — TSH: TSH: 0.86 mIU/L (ref 0.40–4.50)

## 2017-03-02 NOTE — Addendum Note (Signed)
Addended by: Alvina ChouWALSH, Ramelo Oetken J on: 03/02/2017 04:19 PM   Modules accepted: Orders

## 2017-03-03 LAB — HEMOGLOBIN A1C
Hgb A1c MFr Bld: 5.4 % (ref <5.7)
Mean Plasma Glucose: 108 mg/dL

## 2017-03-03 LAB — HEPATITIS C ANTIBODY: HCV AB: NEGATIVE

## 2017-03-07 ENCOUNTER — Ambulatory Visit (INDEPENDENT_AMBULATORY_CARE_PROVIDER_SITE_OTHER): Payer: 59 | Admitting: Family Medicine

## 2017-03-07 ENCOUNTER — Encounter: Payer: Self-pay | Admitting: Family Medicine

## 2017-03-07 VITALS — BP 126/68 | HR 58 | Temp 98.6°F | Ht 70.5 in | Wt 307.2 lb

## 2017-03-07 DIAGNOSIS — I480 Paroxysmal atrial fibrillation: Secondary | ICD-10-CM | POA: Diagnosis not present

## 2017-03-07 DIAGNOSIS — Z1211 Encounter for screening for malignant neoplasm of colon: Secondary | ICD-10-CM | POA: Diagnosis not present

## 2017-03-07 DIAGNOSIS — Z6841 Body Mass Index (BMI) 40.0 and over, adult: Secondary | ICD-10-CM

## 2017-03-07 DIAGNOSIS — Z87891 Personal history of nicotine dependence: Secondary | ICD-10-CM

## 2017-03-07 DIAGNOSIS — R7303 Prediabetes: Secondary | ICD-10-CM | POA: Diagnosis not present

## 2017-03-07 DIAGNOSIS — I10 Essential (primary) hypertension: Secondary | ICD-10-CM

## 2017-03-07 DIAGNOSIS — Z0000 Encounter for general adult medical examination without abnormal findings: Secondary | ICD-10-CM | POA: Diagnosis not present

## 2017-03-07 NOTE — Progress Notes (Signed)
Pre visit review using our clinic review tool, if applicable. No additional management support is needed unless otherwise documented below in the visit note. 

## 2017-03-07 NOTE — Assessment & Plan Note (Signed)
Chronic, stable. Continue current regimen. 

## 2017-03-07 NOTE — Assessment & Plan Note (Signed)
Preventative protocols reviewed and updated unless pt declined. Discussed healthy diet and lifestyle.  

## 2017-03-07 NOTE — Progress Notes (Signed)
BP 126/68 (BP Location: Left Arm, Patient Position: Sitting, Cuff Size: Large)   Pulse (!) 58   Temp 98.6 F (37 C) (Oral)   Ht 5' 10.5" (1.791 m)   Wt (!) 307 lb 4 oz (139.4 kg)   SpO2 97%   BMI 43.46 kg/m    CC: CPE Subjective:    Patient ID: Eric Salazar, male    DOB: 01-20-1961, 56 y.o.   MRN: [id number]  HPI: Eric Salazar is a 56 y.o. male presenting on 03/07/2017 for Annual Exam   Recent DOT physical with occupational health.  Saw cardiology 07/2016 for PAF - converted with beta blocker therapy.   Preventative: Colon cancer screening - iFOB neg 12/2015.  Prostate cancer screening - discussed. Nocturia x1, strong stream. Agrees to screen today.  Flu - declines Tdap 07/2011 Seat belt use discussed Sunscreen use discussed. 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 Edu: 2 years college Activity: walking 30 min/day in am  Diet: good water, vegetables daily   Relevant past medical, surgical, family and social history reviewed and updated as indicated. Interim medical history since our last visit reviewed. Allergies and medications reviewed and updated. Outpatient Medications Prior to Visit  Medication Sig Dispense Refill  . aspirin EC 81 MG tablet Take 81 mg by mouth daily.    . fluticasone (FLONASE) 50 MCG/ACT nasal spray Place 2 sprays into both nostrils daily. 16 g 3  . metoprolol tartrate 37.5 MG TABS Take 37.5 mg by mouth 2 (two) times daily. 60 tablet 6  . Multiple Vitamin (MULTIVITAMIN WITH MINERALS) TABS tablet Take 1 tablet by mouth daily.    . Potassium 99 MG TABS Take 2 tablets by mouth daily.    . vitamin C (ASCORBIC ACID) 500 MG tablet Take 500 mg by mouth daily.       No facility-administered medications prior to visit.      Per HPI unless specifically indicated in ROS section below Review of Systems  Constitutional: Negative for  activity change, appetite change, chills, fatigue, fever and unexpected weight change.  HENT: Negative for hearing loss.   Eyes: Negative for visual disturbance.  Respiratory: Negative for cough, chest tightness, shortness of breath and wheezing.   Cardiovascular: Negative for chest pain, palpitations and leg swelling.  Gastrointestinal: Negative for abdominal distention, abdominal pain, blood in stool, constipation, diarrhea, nausea and vomiting.  Genitourinary: Negative for difficulty urinating and hematuria.  Musculoskeletal: Negative for arthralgias, myalgias and neck pain.  Skin: Negative for rash.  Neurological: Negative for dizziness, seizures, syncope and headaches.  Hematological: Negative for adenopathy. Does not bruise/bleed easily.  Psychiatric/Behavioral: Negative for dysphoric mood. The patient is not nervous/anxious.        Objective:    BP 126/68 (BP Location: Left Arm, Patient Position: Sitting, Cuff Size: Large)   Pulse (!) 58   Temp 98.6 F (37 C) (Oral)   Ht 5' 10.5" (1.791 m)   Wt (!) 307 lb 4 oz (139.4 kg)   SpO2 97%   BMI 43.46 kg/m   Wt Readings from Last 3 Encounters:  03/07/17 (!) 307 lb 4 oz (139.4 kg)  01/05/17 (!) 309 lb 6.4 oz (140.3 kg)  07/22/16 296 lb 8 oz (134.5 kg)    Physical Exam  Constitutional: He is oriented to person, place, and time. He appears well-developed and well-nourished. No distress.  HENT:  Head: Normocephalic and atraumatic.  Right Ear: Hearing, tympanic membrane, external ear and ear canal normal.  Left Ear: Hearing, tympanic membrane, external ear and ear canal normal.  Nose: Nose normal.  Mouth/Throat: Uvula is midline, oropharynx is clear and moist and mucous membranes are normal. No oropharyngeal exudate, posterior oropharyngeal edema or posterior oropharyngeal erythema.  Eyes: Conjunctivae and EOM are normal. Pupils are equal, round, and reactive to light. No scleral icterus.  Neck: Normal range of motion. Neck supple.  No thyromegaly present.  Cardiovascular: Normal rate, regular rhythm, normal heart sounds and intact distal pulses.   No murmur heard. Pulses:      Radial pulses are 2+ on the right side, and 2+ on the left side.  Pulmonary/Chest: Effort normal and breath sounds normal. No respiratory distress. He has no wheezes. He has no rales.  Abdominal: Soft. Bowel sounds are normal. He exhibits no distension and no mass. There is no tenderness. There is no rebound and no guarding.  Genitourinary: Prostate normal. Rectal exam shows external hemorrhoid (noninflamed). Rectal exam shows no fissure, no mass, no tenderness and anal tone normal. Prostate is not enlarged (15gm) and not tender.  Musculoskeletal: Normal range of motion. He exhibits no edema.  Lymphadenopathy:    He has no cervical adenopathy.  Neurological: He is alert and oriented to person, place, and time.  CN grossly intact, station and gait intact  Skin: Skin is warm and dry. No rash noted.  Psychiatric: He has a normal mood and affect. His behavior is normal. Judgment and thought content normal.  Nursing note and vitals reviewed.  Results for orders placed or performed in visit on 03/02/17  Hepatitis C antibody  Result Value Ref Range   HCV Ab NEGATIVE NEGATIVE  Lipid panel  Result Value Ref Range   Cholesterol 200 (H) <200 mg/dL   Triglycerides 161 <096 mg/dL   HDL 49 >04 mg/dL   Total CHOL/HDL Ratio 4.1 <5.0 Ratio   VLDL 23 <30 mg/dL   LDL Cholesterol 540 (H) <100 mg/dL  Comprehensive metabolic panel  Result Value Ref Range   Sodium 139 135 - 146 mmol/L   Potassium 4.2 3.5 - 5.3 mmol/L   Chloride 101 98 - 110 mmol/L   CO2 28 20 - 31 mmol/L   Glucose, Bld 96 65 - 99 mg/dL   BUN 14 7 - 25 mg/dL   Creat 9.81 1.91 - 4.78 mg/dL   Total Bilirubin 0.7 0.2 - 1.2 mg/dL   Alkaline Phosphatase 86 40 - 115 U/L   AST 30 10 - 35 U/L   ALT 46 9 - 46 U/L   Total Protein 6.9 6.1 - 8.1 g/dL   Albumin 3.7 3.6 - 5.1 g/dL   Calcium 9.3 8.6  - 29.5 mg/dL  TSH  Result Value Ref Range   TSH 0.86 0.40 - 4.50 mIU/L  Hemoglobin A1c  Result Value Ref Range   Hgb A1c MFr Bld 5.4 <5.7 %   Mean Plasma Glucose 108 mg/dL  No results found for: PSA     Assessment & Plan:   Problem List Items Addressed This Visit    Body mass index (BMI) 40.0-44.9, adult (HCC)    Discussed healthy diet and lifestyle changes to affect sustainable weight loss.       Essential hypertension    Chronic, stable. Continue current regimen.       Ex-smoker    Remains abstinent.       Healthcare maintenance - Primary  Preventative protocols reviewed and updated unless pt declined. Discussed healthy diet and lifestyle.       Paroxysmal atrial fibrillation (HCC)    Sounds regular today. Continue aspirin  daily.       Prediabetes    cbg's, A1c actually improved.        Other Visit Diagnoses    Special screening for malignant neoplasms, colon       Relevant Orders   Fecal occult blood, imunochemical       Follow up plan: Return in about 1 year (around 03/07/2018) for annual exam, prior fasting for blood work.  Eustaquio Boyden, MD

## 2017-03-07 NOTE — Assessment & Plan Note (Signed)
cbg's, A1c actually improved.

## 2017-03-07 NOTE — Assessment & Plan Note (Signed)
Discussed healthy diet and lifestyle changes to affect sustainable weight loss  

## 2017-03-07 NOTE — Assessment & Plan Note (Signed)
Sounds regular today. Continue aspirin 81mg daily.  

## 2017-03-07 NOTE — Patient Instructions (Addendum)
Pass by our lab to pick up stool kit.  You are doing well today. Continue regular walking.  Return as needed or in 1 year for next physical.    Health Maintenance, Male A healthy lifestyle and preventive care is important for your health and wellness. Ask your health care provider about what schedule of regular examinations is right for you. What should I know about weight and diet?  Eat a Healthy Diet  Eat plenty of vegetables, fruits, whole grains, low-fat dairy products, and lean protein.  Do not eat a lot of foods high in solid fats, added sugars, or salt. Maintain a Healthy Weight  Regular exercise can help you achieve or maintain a healthy weight. You should:  Do at least 150 minutes of exercise each week. The exercise should increase your heart rate and make you sweat (moderate-intensity exercise).  Do strength-training exercises at least twice a week. Watch Your Levels of Cholesterol and Blood Lipids  Have your blood tested for lipids and cholesterol every 5 years starting at 56 years of age. If you are at high risk for heart disease, you should start having your blood tested when you are 56 years old. You may need to have your cholesterol levels checked more often if:  Your lipid or cholesterol levels are high.  You are older than 56 years of age.  You are at high risk for heart disease. What should I know about cancer screening? Many types of cancers can be detected early and may often be prevented. Lung Cancer  You should be screened every year for lung cancer if:  You are a current smoker who has smoked for at least 30 years.  You are a former smoker who has quit within the past 15 years.  Talk to your health care provider about your screening options, when you should start screening, and how often you should be screened. Colorectal Cancer  Routine colorectal cancer screening usually begins at 56 years of age and should be repeated every 5-10 years until you are  56 years old. You may need to be screened more often if early forms of precancerous polyps or small growths are found. Your health care provider may recommend screening at an earlier age if you have risk factors for colon cancer.  Your health care provider may recommend using home test kits to check for hidden blood in the stool.  A small camera at the end of a tube can be used to examine your colon (sigmoidoscopy or colonoscopy). This checks for the earliest forms of colorectal cancer. Prostate and Testicular Cancer  Depending on your age and overall health, your health care provider may do certain tests to screen for prostate and testicular cancer.  Talk to your health care provider about any symptoms or concerns you have about testicular or prostate cancer. Skin Cancer  Check your skin from head to toe regularly.  Tell your health care provider about any new moles or changes in moles, especially if:  There is a change in a mole's size, shape, or color.  You have a mole that is larger than a pencil eraser.  Always use sunscreen. Apply sunscreen liberally and repeat throughout the day.  Protect yourself by wearing long sleeves, pants, a wide-brimmed hat, and sunglasses when outside. What should I know about heart disease, diabetes, and high blood pressure?  If you are 21-66 years of age, have your blood pressure checked every 3-5 years. If you are 48 years of age or  older, have your blood pressure checked every year. You should have your blood pressure measured twice-once when you are at a hospital or clinic, and once when you are not at a hospital or clinic. Record the average of the two measurements. To check your blood pressure when you are not at a hospital or clinic, you can use:  An automated blood pressure machine at a pharmacy.  A home blood pressure monitor.  Talk to your health care provider about your target blood pressure.  If you are between 74-47 years old, ask your  health care provider if you should take aspirin to prevent heart disease.  Have regular diabetes screenings by checking your fasting blood sugar level.  If you are at a normal weight and have a low risk for diabetes, have this test once every three years after the age of 94.  If you are overweight and have a high risk for diabetes, consider being tested at a younger age or more often.  A one-time screening for abdominal aortic aneurysm (AAA) by ultrasound is recommended for men aged 94-75 years who are current or former smokers. What should I know about preventing infection? Hepatitis B  If you have a higher risk for hepatitis B, you should be screened for this virus. Talk with your health care provider to find out if you are at risk for hepatitis B infection. Hepatitis C  Blood testing is recommended for:  Everyone born from [birth year] through 1965.  Anyone with known risk factors for hepatitis C. Sexually Transmitted Diseases (STDs)  You should be screened each year for STDs including gonorrhea and chlamydia if:  You are sexually active and are younger than 56 years of age.  You are older than 56 years of age and your health care provider tells you that you are at risk for this type of infection.  Your sexual activity has changed since you were last screened and you are at an increased risk for chlamydia or gonorrhea. Ask your health care provider if you are at risk.  Talk with your health care provider about whether you are at high risk of being infected with HIV. Your health care provider may recommend a prescription medicine to help prevent HIV infection. What else can I do?  Schedule regular health, dental, and eye exams.  Stay current with your vaccines (immunizations).  Do not use any tobacco products, such as cigarettes, chewing tobacco, and e-cigarettes. If you need help quitting, ask your health care provider.  Limit alcohol intake to no more than 2 drinks per day. One drink  equals 12 ounces of beer, 5 ounces of wine, or 1 ounces of hard liquor.  Do not use street drugs.  Do not share needles.  Ask your health care provider for help if you need support or information about quitting drugs.  Tell your health care provider if you often feel depressed.  Tell your health care provider if you have ever been abused or do not feel safe at home. This information is not intended to replace advice given to you by your health care provider. Make sure you discuss any questions you have with your health care provider. Document Released: 05/19/2008 Document Revised: 07/20/2016 Document Reviewed: 08/25/2015 Elsevier Interactive Patient Education  2017 [employer].

## 2017-03-07 NOTE — Assessment & Plan Note (Signed)
Remains abstinent 

## 2017-03-15 NOTE — Progress Notes (Signed)
Cardiology Office Note  Date:  03/16/2017   ID:  Eric Salazar, DOB 26-Oct-1961, MRN [id number]  PCP:  Eustaquio Boyden, MD   Chief Complaint  Patient presents with  . other    Early f/u due to chest pain and headaches. Pt c/o sob at times. Reviewed meds with pt verbally.    HPI:  56 year old male with  hypertension ,  Prior smoking history,  previous heavy alcohol use per the notes   admitted January 2017 with chest pain and shortness of breath, found to be in atrial fibrillation with RVR, initial rate control with beta blockers, converting to normal sinus rhythm.  not placed on anticoagulation.  Mild OSA, does not want CPAP He presents today for follow-up of his paroxysmal atrial fibrillation And new symptoms of chest pain  In follow-up today he reports having a strange sensation in his head Periodically will have Very short Headache Pain Describes it as a "balloon popping" In his head Sometimes in the side, sometimes in the top Reports having this for years, possibly 10 years. No clear association with anything  Also reported having one episode of Chest pain, migrates Around his chest Not associated with exertion When he exerts himself, typically does not have any shortness of breath or chest pain  Periodical high blood pressure Takes metoprolol 37.5 mg po BID, Blood pressure better  Anxiety about his health Some smoking, Not heavy volume  denies shortness of breath  No regular exercise program. 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  Stress testing was nonischemic.  PMH:   has a past medical history of History of tobacco abuse; Morbid obesity (HCC); Paroxysmal atrial fibrillation (HCC); Precordial chest pain; and Prediabetes.  PSH:    Past Surgical History:  Procedure Laterality Date  . CARDIOVASCULAR STRESS TEST  12/2015   low risk study, EF 55-65%  . DOBUTAMINE STRESS ECHO  01/2011   for typical/atypical chest pain - good tolerance, 11 METS, no significant abnormalities  . MRI lumbar  2008   disc protrusion L4/5 with impingement L5 and probably L4 nerve roots    Current Outpatient Prescriptions  Medication Sig Dispense Refill  . aspirin EC 81 MG tablet Take 81 mg by mouth daily.    . fluticasone (FLONASE) 50 MCG/ACT nasal spray Place 2 sprays into both nostrils daily. 16 g 3  . metoprolol tartrate (LOPRESSOR) 25 MG tablet Take 37.5 mg by mouth 2 (two) times daily.    . Multiple Vitamin (MULTIVITAMIN WITH MINERALS) TABS tablet Take 1 tablet by mouth daily.    . Potassium 99 MG TABS Take 2 tablets by mouth daily.    . vitamin C (ASCORBIC ACID) 500 MG tablet Take 500 mg by mouth daily.       No current facility-administered medications for this visit.      Allergies:   Patient has no known allergies.   Social History:  The patient  reports that he  has been smoking Cigarettes.  He has never used smokeless tobacco. He reports that he drinks about 1.8 oz of alcohol per week . He reports that he does not use drugs.   Family History:   family history includes Coronary artery disease (age of onset: 42) in his mother; Diabetes in his mother; Healthy in his sister; Heart attack in his mother; Hypertension in his father and mother.    Review of Systems: Review of Systems  Constitutional: Negative.   Respiratory: Negative.   Cardiovascular: Positive for chest pain.  Gastrointestinal: Negative.   Musculoskeletal:  Negative.   Neurological: Positive for headaches.  Psychiatric/Behavioral: Negative.   All other systems reviewed and are negative.    PHYSICAL EXAM: VS:  BP 136/86 (BP Location: Left Arm, Patient Position: Sitting, Cuff Size: Large)   Pulse (!) 59   Ht  (1.778 m)   Wt (!) 305 lb 12 oz (138.7 kg)   BMI 43.87 kg/m  , BMI Body mass index is 43.87 kg/m. GEN: Well nourished, well developed, in no acute distress, Obese  HEENT: normal  Neck: no JVD, carotid bruits, or masses Cardiac: RRR; no murmurs, rubs, or gallops,no edema  Respiratory:  clear to auscultation bilaterally, normal work of breathing GI: soft, nontender, nondistended, + BS MS: no deformity or atrophy  Skin: warm and dry, no rash Neuro:  Strength and sensation are intact Psych: euthymic mood, full affect    Recent Labs: 03/02/2017: ALT 46; BUN 14; Creat 1.06; Potassium 4.2; Sodium 139; TSH 0.86    Lipid Panel Lab Results  Component Value Date   CHOL 200 (H) 03/02/2017   HDL 49 03/02/2017   LDLCALC 128 (H) 03/02/2017   TRIG 115 03/02/2017      Wt Readings from Last 3 Encounters:  03/16/17 (!) 305 lb 12 oz (138.7 kg)  03/07/17 (!) 307 lb 4 oz (139.4 kg)  01/05/17 (!) 309 lb 6.4 oz (140.3 kg)       ASSESSMENT AND PLAN:  Essential hypertension Blood pressure is well controlled on today's visit. No changes made to the medications.  Paroxysmal atrial fibrillation (HCC) Denies having any palpitations or tachycardia concerning for arrhythmia Recommended he monitor his pulse when he has headaches or popping sensation in his head. If he does note a regular rhythm, recommended he call our office  Ex-smoker Still smoking several cigarettes per day We have encouraged him to continue to work on weaning his cigarettes and smoking cessation. He will continue to work on this and does not want any assistance with chantix.   Body mass index (BMI) 40.0-44.9, adult (HCC) We have encouraged continued exercise,  careful diet management in an effort to lose weight.  SOB (shortness of breath) Denies having significant shortness of breath on exertion No regular exercise program. Recommended weight loss, walking program.  Mild sleep apnea  Nonintractable headache, unspecified chronicity pattern, unspecified headache type Etiology of his symptoms is unclear, dating back many years Describes as a "popping sensation" in his head, sometimes painful, very quick, do not linger Recommended he monitor his pulse when he has episodes If symptoms get worse, may need CT scan head  Chest pain, unspecified type Atypical in nature, presented at rest, not associated with exertion We discussed all types of stress testing and screening methods He is interested in CT coronary calcium score, test discussed with him in detail This has been ordered, he will schedule on his own. Direct phone number given to him We also talked about routine  treadmill study, treadmill stress echo, Myoview in detail Recommended he call us if he has worsening symptoms   Total encounter time more than 45 minutes  Greater than 50% was spent in counseling and coordination of care with the patient   Disposition:   F/U  12 months  No orders of the defined types were placed in this encounter.    Signed, Dossie Arbour, M.D., Ph.D. 03/16/2017  [facility] [place], [place] [phone]

## 2017-03-16 ENCOUNTER — Ambulatory Visit (INDEPENDENT_AMBULATORY_CARE_PROVIDER_SITE_OTHER): Payer: 59 | Admitting: Cardiovascular Disease

## 2017-03-16 ENCOUNTER — Encounter: Payer: Self-pay | Admitting: Cardiovascular Disease

## 2017-03-16 VITALS — BP 136/86 | HR 59 | Ht 70.0 in | Wt 305.8 lb

## 2017-03-16 DIAGNOSIS — R0602 Shortness of breath: Secondary | ICD-10-CM

## 2017-03-16 DIAGNOSIS — Z6841 Body Mass Index (BMI) 40.0 and over, adult: Secondary | ICD-10-CM | POA: Diagnosis not present

## 2017-03-16 DIAGNOSIS — Z8249 Family history of ischemic heart disease and other diseases of the circulatory system: Secondary | ICD-10-CM | POA: Diagnosis not present

## 2017-03-16 DIAGNOSIS — I10 Essential (primary) hypertension: Secondary | ICD-10-CM | POA: Diagnosis not present

## 2017-03-16 DIAGNOSIS — G4730 Sleep apnea, unspecified: Secondary | ICD-10-CM

## 2017-03-16 DIAGNOSIS — R51 Headache: Secondary | ICD-10-CM | POA: Diagnosis not present

## 2017-03-16 DIAGNOSIS — Z87891 Personal history of nicotine dependence: Secondary | ICD-10-CM

## 2017-03-16 DIAGNOSIS — R519 Headache, unspecified: Secondary | ICD-10-CM

## 2017-03-16 DIAGNOSIS — R079 Chest pain, unspecified: Secondary | ICD-10-CM

## 2017-03-16 DIAGNOSIS — I480 Paroxysmal atrial fibrillation: Secondary | ICD-10-CM

## 2017-03-16 NOTE — Patient Instructions (Addendum)
Medication Instructions:   No medication changes made  Labwork:  No new labs needed  Testing/Procedures:  We will order a CT coronary calcium score  For chest pain, mother with MI Call [phone] to schedule.   I recommend watching educational videos on topics of interest to you at:       [email or web address]  Enter code: HEARTCARE    Follow-Up: It was a pleasure seeing you in the office today. Please call us if you have new issues that need to be addressed before your next appt.  [phone]  Your physician wants you to follow-up in: 12 months.  You will receive a reminder letter in the mail two months in advance. If you don't receive a letter, please call our office to schedule the follow-up appointment.  If you need a refill on your cardiac medications before your next appointment, please call your pharmacy.

## 2017-06-06 ENCOUNTER — Other Ambulatory Visit: Payer: Self-pay | Admitting: Family Medicine

## 2017-06-29 ENCOUNTER — Ambulatory Visit (INDEPENDENT_AMBULATORY_CARE_PROVIDER_SITE_OTHER)
Admission: RE | Admit: 2017-06-29 | Discharge: 2017-06-29 | Disposition: A | Discharge status: Home / Self Care | Payer: Self-pay | Source: Ambulatory Visit | Attending: Cardiovascular Disease | Admitting: Cardiovascular Disease

## 2017-06-29 DIAGNOSIS — I10 Essential (primary) hypertension: Secondary | ICD-10-CM

## 2017-06-29 DIAGNOSIS — G4730 Sleep apnea, unspecified: Secondary | ICD-10-CM

## 2017-06-29 DIAGNOSIS — R51 Headache: Secondary | ICD-10-CM

## 2017-06-29 DIAGNOSIS — R0602 Shortness of breath: Secondary | ICD-10-CM

## 2017-06-29 DIAGNOSIS — I480 Paroxysmal atrial fibrillation: Secondary | ICD-10-CM

## 2017-06-29 DIAGNOSIS — R519 Headache, unspecified: Secondary | ICD-10-CM

## 2017-06-29 DIAGNOSIS — R079 Chest pain, unspecified: Secondary | ICD-10-CM

## 2017-06-29 DIAGNOSIS — Z6841 Body Mass Index (BMI) 40.0 and over, adult: Secondary | ICD-10-CM

## 2017-06-29 DIAGNOSIS — Z87891 Personal history of nicotine dependence: Secondary | ICD-10-CM

## 2017-06-29 DIAGNOSIS — Z8249 Family history of ischemic heart disease and other diseases of the circulatory system: Secondary | ICD-10-CM

## 2017-07-20 ENCOUNTER — Encounter: Payer: Self-pay | Admitting: Family Medicine

## 2017-07-20 ENCOUNTER — Ambulatory Visit (INDEPENDENT_AMBULATORY_CARE_PROVIDER_SITE_OTHER): Payer: 59 | Admitting: Family Medicine

## 2017-07-20 VITALS — BP 132/88 | HR 70 | Temp 98.5°F | Ht 70.0 in | Wt 301.1 lb

## 2017-07-20 DIAGNOSIS — M5412 Radiculopathy, cervical region: Secondary | ICD-10-CM | POA: Diagnosis not present

## 2017-07-20 MED ORDER — PREDNISONE 20 MG PO TABS: ORAL_TABLET | ORAL | 0 refills | Status: DC

## 2017-07-20 NOTE — Progress Notes (Signed)
Dr. Karleen Hampshire T. Dylann Gallier, MD, CAQ Sports Medicine Primary [facility] and Sports Medicine [address] [place], [address] Phone: [phone] Fax: [phone]  07/20/2017  Patient: Eric Salazar, MRN: [id number], DOB: 08-Mar-1961, 56 y.o.  Primary Physician:  Eustaquio Boyden, MD   Chief Complaint  Patient presents with  . Acute Visit    Pt here today c/o neck pain in the back that extends down his left arm, pt states his sx started x 3 weeks go, pt states years ago he was informed that something was wrong with his spine but he is unsure, he has tried some heat and ice to try to bring some relief, he states it doesnt bother him when he is walking and standing but when he sits is when he notices    Subjective:   Eric Salazar is a 56 y.o. very pleasant male patient who presents with the following:  3 weeks ago, started to feel some pain in his L upper arm and down through the shoulder. No pain with standing or walking. He notices this significantly when he is sitting, and also when he is lying down.  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  These are and she has that range from about 8-10 hours.  Took some Motrin 800 mg.  Has tried some ice.  He also tried some old Flexeril, which didn't seem to help very much.  He is not having any weakness.  Past Medical History, Surgical History, Social History, Family History, Problem List, Medications, and Allergies have been reviewed and updated if relevant.  Patient Active Problem List   Diagnosis Date Noted  . Nonintractable headache 01/05/2017  . Mild sleep apnea 07/22/2016  . Marijuana use 01/07/2016  . Chest pain   . Paroxysmal atrial fibrillation (HCC) 12/23/2015  . SOB (shortness of breath)   . Essential hypertension 11/07/2014  . Leg pain 12/12/2011  . Prediabetes   . Ex-smoker 06/06/2011  . Body mass index (BMI) 40.0-44.9, adult (HCC) 06/06/2011  . Healthcare maintenance 06/06/2011     Past Medical History:  Diagnosis Date  . History of tobacco abuse    a.Quit 2016.  . Morbid obesity (HCC)   . Paroxysmal atrial fibrillation (HCC)    a. 12/2015 Echo: EF 60-65%, no rwma; b. Converted on oral BB; c. CHA2DS2VASc = 0-->on ASA 81.  . Precordial chest pain    a. 12/2015 in setting of AF RVR;  b. 12/2015 MV: low risk, EF 55-65%.  . Prediabetes     Past Surgical History:  Procedure Laterality Date  . CARDIOVASCULAR STRESS TEST  12/2015   low risk study, EF 55-65%  . DOBUTAMINE STRESS ECHO  01/2011   for typical/atypical chest pain - good tolerance, 11 METS, no significant abnormalities  . MRI lumbar  2008   disc protrusion L4/5 with impingement L5 and probably L4 nerve roots    Social History   Social History  . Marital status: Married    Spouse name: N/A  . Number of children: 1  . 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 Smoking status: Current Some Day Smoker    Types: Cigarettes  . Smokeless tobacco: Never Used     Comment: varies  . Alcohol use 1.8 oz/week    3 Shots of liquor per week  Comment: occasionally  . Drug use: No  . Sexual activity: Not on file   Other Topics Concern  . Not on file   Social History Narrative   Caffeine: rare   Lives with wife, son (2002), 3 Social research officer, [employer]   Manages convenience store.   Edu: 2 years college   Activity: not much, at work, playing with son   Diet: good fruits/vegetables    Family History  Problem Relation Age of Onset  . Hypertension Mother   . Diabetes Mother        prediabetes  . Coronary artery disease Mother [id number]       MI  . Heart attack Mother   . Hypertension Father   . Healthy Sister   . Cancer Neg Hx     No Known Allergies  Medication list reviewed and updated in full in [facility].  GEN: no acute illness or fever CV: No chest pain or shortness of breath MSK: detailed above Neuro:  neurological signs are described above ROS O/w per HPI  Objective:   BP 132/88 (BP Location: Left Arm, Patient Position: Sitting, Cuff Size: Normal)   Pulse 70   Temp 98.5 F (36.9 C) (Oral)   Ht 5\' 10"  (1.778 m)   Wt (!) 301 lb 1.6 oz (136.6 kg)   SpO2 95%   BMI 43.20 kg/m    GEN: Well-developed,well-nourished,in no acute distress; alert,appropriate and cooperative throughout examination HEENT: Normocephalic and atraumatic without obvious abnormalities. Ears, externally no deformities PULM: Breathing comfortably in no respiratory distress EXT: No clubbing, cyanosis, or edema PSYCH: Normally interactive. Cooperative during the interview. Pleasant. Friendly and conversant. Not anxious or depressed appearing. Normal, full affect.  CERVICAL SPINE EXAM Range of motion: Flexion, extension, lateral bending, and rotation: 10% loss of motion, worse with lateral movements. Pain with terminal motion: yes Spinous Processes: NT SCM: NT Upper paracervical muscles: ttp Upper traps: ttp C5-T1 intact, sensation and motor  str 5/5  Radiology:  Assessment and Plan:   Cervical radiculopathy  Subjective radicular sensation on the left without frank numbness or weakness.  More likely nerve encroachment on the left, disc pathology would be most likely.  Gave the patient some range of motion as well as basic McKenzie protocol and some basic self traction to try at home.  Follow-up: No Follow-up on file.  Meds ordered this encounter  Medications  . predniSONE (DELTASONE) 20 MG tablet    Sig: 2 tabs po for 5 days, then 1 tab po for 5 days    Dispense:  15 tablet    Refill:  0   Signed,  Shanetra Blumenstock T. Syair Fricker, MD   Allergies as of 07/20/2017   No Known Allergies     Medication List       Accurate as of 07/20/17 11:59 PM. Always use your most recent med list.          aspirin EC 81 MG tablet Take 81 mg by mouth daily.   fluticasone 50 MCG/ACT nasal spray Commonly known as:   FLONASE Place 2 sprays into both nostrils daily.   metoprolol tartrate 25 MG tablet Commonly known as:  LOPRESSOR TAKE 1 AND 1/2 TABLET BY MOUTH TWICE A DAY   multivitamin with minerals Tabs tablet Take 1 tablet by mouth daily.   Potassium 99 MG Tabs Take 2 tablets by mouth daily.   predniSONE 20 MG tablet Commonly known as:  DELTASONE 2 tabs po for 5 days, then 1 tab po for 5 days  vitamin C 500 MG tablet Commonly known as:  ASCORBIC ACID Take 500 mg by mouth daily.

## 2017-07-31 ENCOUNTER — Encounter: Payer: Self-pay | Admitting: Family Medicine

## 2017-08-01 ENCOUNTER — Encounter: Payer: Self-pay | Admitting: *Deleted

## 2017-08-01 MED ORDER — AMITRIPTYLINE HCL 25 MG PO TABS: 25.0000 mg | ORAL_TABLET | Freq: Every day | ORAL | 2 refills | Status: DC

## 2017-08-01 MED ORDER — PREDNISONE 20 MG PO TABS: ORAL_TABLET | ORAL | 0 refills | Status: DC

## 2017-08-01 NOTE — Telephone Encounter (Signed)
This encounter was created in error - please disregard.

## 2017-08-01 NOTE — Telephone Encounter (Signed)
Left message for Mr. Koker to start amitryptiline for nerve pain as well as repeat course of prednisone.  I advised him that Dr. Patsy Lager was going to put in a referral for physical therapy if he is agreeable.  Ask to please schedule a follow up appointment with Dr. Patsy Lager in 4 to 5 weeks.  I also sent this information to patient via MyChart.  Prescriptions sent into [facility]

## 2017-08-01 NOTE — Telephone Encounter (Signed)
Lupita Leash can you call the patient?  Have him start Amitrptilin

## 2017-08-01 NOTE — Telephone Encounter (Signed)
Will cc Dr Patsy Lager who last saw patient for recs. Let me know if you'd like me to order anything.

## 2017-08-01 NOTE — Telephone Encounter (Signed)
Eric Salazar, can you call the patient?  We will handle, G  Have him start amitryptiline for nerve pain, I would repeat course of prednisone, have him do formal physical therapy if he agrees. I am happy to set PT up  F/u with me in 4-5 weeks.  Amitryptiline 25 mg, 1 po QHS, 30, 2 ref  Prednisone 20 mg, 2 tabs po for 5 days, then 1 tab po for 5 days, #15, 0 ref

## 2017-08-04 ENCOUNTER — Other Ambulatory Visit: Payer: Self-pay | Admitting: Family Medicine

## 2018-01-24 ENCOUNTER — Ambulatory Visit: Payer: 59 | Admitting: Family Medicine

## 2018-01-24 ENCOUNTER — Ambulatory Visit: Payer: Self-pay | Admitting: Family Medicine

## 2018-01-24 ENCOUNTER — Encounter: Payer: Self-pay | Admitting: Family Medicine

## 2018-01-24 VITALS — BP 160/88 | HR 79 | Temp 97.9°F | Wt 320.0 lb

## 2018-01-24 DIAGNOSIS — M545 Low back pain, unspecified: Secondary | ICD-10-CM | POA: Insufficient documentation

## 2018-01-24 DIAGNOSIS — I10 Essential (primary) hypertension: Secondary | ICD-10-CM | POA: Diagnosis not present

## 2018-01-24 DIAGNOSIS — M79605 Pain in left leg: Secondary | ICD-10-CM

## 2018-01-24 DIAGNOSIS — Z6841 Body Mass Index (BMI) 40.0 and over, adult: Secondary | ICD-10-CM

## 2018-01-24 MED ORDER — METOPROLOL TARTRATE 50 MG PO TABS: 50.0000 mg | ORAL_TABLET | Freq: Two times a day (BID) | ORAL | 6 refills | Status: DC

## 2018-01-24 MED ORDER — PREDNISONE 20 MG PO TABS: ORAL_TABLET | ORAL | 0 refills | Status: DC

## 2018-01-24 NOTE — Patient Instructions (Addendum)
You have lumbar strain as well as likely bulging discs without herniation.  Take prednisone course for inflammation. Do exercises provided today for lower back - if no improvement with above, let us know for physical therapy referral.  Increase walking. Work on Designer, fashion/clothingcore strengthening.  Let me know how you do.

## 2018-01-24 NOTE — Progress Notes (Signed)
BP (!) 160/88 (BP Location: Right Arm, Cuff Size: Large)   Pulse 79   Temp 97.9 F (36.6 C) (Oral)   Wt (!) 320 lb (145.2 kg)   SpO2 97%   BMI 45.92 kg/m   bp on recheck 170/90s CC: back pain Subjective:    Patient ID: Eric Salazar, male    DOB: 10-Jan-1961, 57 y.o.   MRN: [id number]  HPI: Eric Salazar is a 57 y.o. male presenting on 01/24/2018 for Back Pain (Pain in lower back radiating down to left LE. Has pinched nerve. Started about 2 wks ago. )   2 wk h/o L hip pain with radiation to knee (anterior thigh) as well as L lower back pain. Worse with prolonged sitting. Standing and walking is ok. Worse pain with bending at waist. Worse when he gets up in the morning. Denies fevers/chills, numbness/weakness of leg, bowel/bladder accidents. Previous chiropractor has not helped. Requests MRI. 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 So far has tried tylenol without improvement.   Seen 07/2017 for cervical radiculopathy symptoms, treated with prednisone course with improvement.   Weight gain noted since last visit. Hypertensive - complaint with metoprolol 37.5mg  bid.  Known HNP from MRI 2008 per prior records.   Relevant past medical, surgical, family and social history reviewed and updated as indicated. Interim medical history since our last visit reviewed. Allergies and medications reviewed and updated. Outpatient Medications Prior to Visit  Medication Sig Dispense Refill  . aspirin EC 81 MG tablet Take 81 mg by mouth daily.    . Multiple Vitamin (MULTIVITAMIN WITH MINERALS) TABS tablet Take 1 tablet by mouth daily.    . Potassium 99 MG TABS Take 2 tablets by mouth daily.    . vitamin C (ASCORBIC ACID) 500 MG tablet Take 500 mg by mouth daily.      . metoprolol tartrate (LOPRESSOR) 25 MG tablet TAKE 1 AND 1/2 TABLET BY MOUTH TWICE A DAY 90 tablet 8  . amitriptyline (ELAVIL) 25 MG tablet Take 1 tablet (25 mg total) by mouth at  bedtime. (Patient not taking: Reported on 01/24/2018) 30 tablet 2  . fluticasone (FLONASE) 50 MCG/ACT nasal spray Place 2 sprays into both nostrils daily. 16 g 3  . predniSONE (DELTASONE) 20 MG tablet 2 tabs po for 5 days, then 1 tab po for 5 days 15 tablet 0   No facility-administered medications prior to visit.      Per HPI unless specifically indicated in ROS section below Review of Systems     Objective:    BP (!) 160/88 (BP Location: Right Arm, Cuff Size: Large)   Pulse 79   Temp 97.9 F (36.6 C) (Oral)   Wt (!) 320 lb (145.2 kg)   SpO2 97%   BMI 45.92 kg/m   Wt Readings from Last 3 Encounters:  01/24/18 (!) 320 lb (145.2 kg)  07/20/17 (!) 301 lb 1.6 oz (136.6 kg)  03/16/17 (!) 305 lb 12 oz (138.7 kg)    Physical Exam  Constitutional: He is oriented to person, place, and time. He appears well-developed and well-nourished. No distress.  Musculoskeletal: He exhibits no edema.  No pain midline spine No paraspinous mm tenderness Neg SLR bilaterally. No pain with int/ext rotation at hip. Neg FABER. No pain at SIJ, GTB or sciatic notch bilaterally.   Neurological: He is alert and oriented to person, place, and time. He has normal strength. No sensory deficit.  5/5 strength  BLE Sensation intact  Skin: Skin is warm and dry. No rash noted.  Nursing note and vitals reviewed.  Results for orders placed or performed in visit on 03/02/17  Hepatitis C antibody  Result Value Ref Range   HCV Ab NEGATIVE NEGATIVE  Lipid panel  Result Value Ref Range   Cholesterol 200 (H) <200 mg/dL   Triglycerides 914 <782 mg/dL   HDL 49 >95 mg/dL   Total CHOL/HDL Ratio 4.1 <5.0 Ratio   VLDL 23 <30 mg/dL   LDL Cholesterol 621 (H) <100 mg/dL  Comprehensive metabolic panel  Result Value Ref Range   Sodium 139 135 - 146 mmol/L   Potassium 4.2 3.5 - 5.3 mmol/L   Chloride 101 98 - 110 mmol/L   CO2 28 20 - 31 mmol/L   Glucose, Bld 96 65 - 99 mg/dL   BUN 14 7 - 25 mg/dL   Creat 3.08 6.57 -  8.46 mg/dL   Total Bilirubin 0.7 0.2 - 1.2 mg/dL   Alkaline Phosphatase 86 40 - 115 U/L   AST 30 10 - 35 U/L   ALT 46 9 - 46 U/L   Total Protein 6.9 6.1 - 8.1 g/dL   Albumin 3.7 3.6 - 5.1 g/dL   Calcium 9.3 8.6 - 96.2 mg/dL  TSH  Result Value Ref Range   TSH 0.86 0.40 - 4.50 mIU/L  Hemoglobin A1c  Result Value Ref Range   Hgb A1c MFr Bld 5.4 <5.7 %   Mean Plasma Glucose 108 mg/dL      Assessment & Plan:  Over 25 minutes were spent face-to-face with the patient during this encounter and >50% of that time was spent on counseling and coordination of care  Problem List Items Addressed This Visit    Body mass index (BMI) 40.0-44.9, adult (HCC) - Primary   Essential hypertension    Chronic, deteriorated. Will increase metoprolol to 50mg  bid. RTC 1 mo f/u visit.       Relevant Medications   metoprolol tartrate (LOPRESSOR) 50 MG tablet   Low back pain radiating to left lower extremity    Anticipate lumbar back pain from lumbar strain and bulging disc without significant herniation. Supportive care reviewed - prednisone course (avoid NSAIDs in aspirin use), exercises from SM pt advisor provided today, and rec walking routine for goal weight loss. If no better, will refer to PT. Discussed xray and MRI not indicated currently. Pt agrees with plan.       Relevant Medications   predniSONE (DELTASONE) 20 MG tablet       Meds ordered this encounter  Medications  . predniSONE (DELTASONE) 20 MG tablet    Sig: Take two tablets daily for 3 days followed by one tablet daily for 4 days    Dispense:  10 tablet    Refill:  0  . metoprolol tartrate (LOPRESSOR) 50 MG tablet    Sig: Take 1 tablet (50 mg total) by mouth 2 (two) times daily.    Dispense:  60 tablet    Refill:  6   No orders of the defined types were placed in this encounter.   Follow up plan: Return in about 4 weeks (around 02/21/2018), or if symptoms worsen or fail to improve, for follow up visit.  Eustaquio Boyden, MD

## 2018-01-24 NOTE — Assessment & Plan Note (Signed)
Chronic, deteriorated. Will increase metoprolol to 50mg  bid. RTC 1 mo f/u visit.

## 2018-01-24 NOTE — Assessment & Plan Note (Addendum)
Anticipate lumbar back pain from lumbar strain and bulging disc without significant herniation. Supportive care reviewed - prednisone course (avoid NSAIDs in aspirin use), exercises from SM pt advisor provided today, and rec walking routine for goal weight loss. If no better, will refer to PT. Discussed xray and MRI not indicated currently. Pt agrees with plan.

## 2018-02-21 ENCOUNTER — Ambulatory Visit: Payer: 59 | Admitting: Family Medicine

## 2018-02-21 ENCOUNTER — Encounter: Payer: Self-pay | Admitting: Family Medicine

## 2018-02-21 VITALS — BP 130/80 | HR 73 | Temp 98.1°F | Wt 319.0 lb

## 2018-02-21 DIAGNOSIS — Z720 Tobacco use: Secondary | ICD-10-CM

## 2018-02-21 DIAGNOSIS — I10 Essential (primary) hypertension: Secondary | ICD-10-CM

## 2018-02-21 NOTE — Progress Notes (Signed)
BP 130/80 (BP Location: Left Arm, Patient Position: Sitting, Cuff Size: Large)   Pulse 73   Temp 98.1 F (36.7 C) (Oral)   Wt (!) 319 lb (144.7 kg)   SpO2 97%   BMI 45.77 kg/m    CC: HTN f/u visit Subjective:    Patient ID: Eric Salazar, male    DOB: [birth year], 57 y.o.   MRN: [id number]  HPI: Eric FeeMichael W Kreher is a 57 y.o. male presenting on 02/21/2018 for Hypertension (Here for 1 mo f/u after increase in metoprolol.)   See prior note for details. Last visit due to uncontrolled hypertension we increased metoprolol to 50mg  bid. He is compliant with medication and tolerating well.   Denies HA, vision changes, CP/tightness, SOB, leg swelling.   Back is feeling better Minimal smoking - using E cigs to help him quit.   Relevant past medical, surgical, family and social history reviewed and updated as indicated. Interim medical history since our last visit reviewed. Allergies and medications reviewed and updated. Outpatient Medications Prior to Visit  Medication Sig Dispense Refill  . aspirin EC 81 MG tablet Take 81 mg by mouth daily.    . metoprolol tartrate (LOPRESSOR) 50 MG tablet Take 1 tablet (50 mg total) by mouth 2 (two) times daily. 60 tablet 6  . Multiple Vitamin (MULTIVITAMIN WITH MINERALS) TABS tablet Take 1 tablet by mouth daily.    . Potassium 99 MG TABS Take 2 tablets by mouth daily.    . vitamin C (ASCORBIC ACID) 500 MG tablet Take 500 mg by mouth daily.      . predniSONE (DELTASONE) 20 MG tablet Take two tablets daily for 3 days followed by one tablet daily for 4 days 10 tablet 0   No facility-administered medications prior to visit.      Per HPI unless specifically indicated in ROS section below Review of Systems     Objective:    BP 130/80 (BP Location: Left Arm, Patient Position: Sitting, Cuff Size: Large)   Pulse 73   Temp 98.1 F (36.7 C) (Oral)   Wt (!) 319 lb (144.7 kg)   SpO2 97%   BMI 45.77 kg/m   Wt Readings from Last 3 Encounters:    02/21/18 (!) 319 lb (144.7 kg)  01/24/18 (!) 320 lb (145.2 kg)  07/20/17 (!) 301 lb 1.6 oz (136.6 kg)    Physical Exam  Constitutional: He appears well-developed and well-nourished. No distress.  HENT:  Mouth/Throat: Oropharynx is clear and moist. No oropharyngeal exudate.  Cardiovascular: Normal rate, regular rhythm, normal heart sounds and intact distal pulses.  No murmur heard. Pulmonary/Chest: Effort normal and breath sounds normal. No respiratory distress. He has no wheezes. He has no rales.  Musculoskeletal: He exhibits no edema.  Psychiatric: He has a normal mood and affect.  Nursing note and vitals reviewed.     Assessment & Plan:   Problem List Items Addressed This Visit    Essential hypertension - Primary    Chronic, improved. Continue higher metoprolol 50mg  bid dose      Tobacco use    Encouraged full cessation. Using E cig, minimal cigarettes.           No orders of the defined types were placed in this encounter.  No orders of the defined types were placed in this encounter.   Follow up plan: Return in about 3 months (around 05/24/2018), or if symptoms worsen or fail to improve, for annual exam, prior fasting for blood work.  Ria Bush, MD

## 2018-02-21 NOTE — Patient Instructions (Signed)
Blood pressures are doing better - continue current regimen.

## 2018-02-21 NOTE — Assessment & Plan Note (Signed)
Chronic, improved. Continue higher metoprolol 50mg  bid dose

## 2018-02-21 NOTE — Assessment & Plan Note (Signed)
Encouraged full cessation. Using E cig, minimal cigarettes.

## 2018-03-17 ENCOUNTER — Encounter: Payer: Self-pay | Admitting: Family Medicine

## 2018-03-21 ENCOUNTER — Telehealth: Payer: Self-pay | Admitting: Family Medicine

## 2018-03-21 MED ORDER — PREDNISONE 20 MG PO TABS
ORAL_TABLET | ORAL | 0 refills | Status: DC
Start: 2018-03-21 — End: 2018-05-30

## 2018-03-21 NOTE — Telephone Encounter (Signed)
Copied from CRM [phone]. Topic: General - Other >> Mar 21, 2018  8:48 AM Cecelia ByarsGreen, Hollyn Stucky L, RMA wrote: Reason for CRM: patient is requesting a call back concerning email sent on 03/17/18, please return pt call at [phone]

## 2018-03-21 NOTE — Telephone Encounter (Signed)
Left message on vm per dpr relaying Dr. G's message.  

## 2018-03-21 NOTE — Telephone Encounter (Signed)
Mychart message returned - plz let patient know another prednisone course sent to pharmacy. If no better with this, next step would likely by PT.

## 2018-04-04 ENCOUNTER — Encounter: Payer: Self-pay | Admitting: Family Medicine

## 2018-04-04 DIAGNOSIS — M545 Low back pain, unspecified: Secondary | ICD-10-CM

## 2018-04-04 DIAGNOSIS — M79605 Pain in left leg: Secondary | ICD-10-CM

## 2018-04-05 ENCOUNTER — Telehealth: Payer: Self-pay

## 2018-04-05 NOTE — Telephone Encounter (Signed)
Copied from CRM [id number]. Topic: Referral - Request >> Apr 04, 2018  5:28 PM Eric Salazar B wrote: Reason for CRM: pt called to initiate a referral for a PT , call pt to advise

## 2018-04-05 NOTE — Telephone Encounter (Signed)
Please see 03/21/18 phone note.

## 2018-04-05 NOTE — Telephone Encounter (Signed)
Referral placed. See mychart message

## 2018-04-06 ENCOUNTER — Encounter (INDEPENDENT_AMBULATORY_CARE_PROVIDER_SITE_OTHER): Payer: Self-pay

## 2018-05-18 ENCOUNTER — Telehealth: Payer: Self-pay

## 2018-05-18 NOTE — Telephone Encounter (Signed)
Left message for patient to call [name] back in regards to a referral-[name], RMA   

## 2018-05-21 ENCOUNTER — Other Ambulatory Visit: Payer: Self-pay | Admitting: Family Medicine

## 2018-05-21 DIAGNOSIS — I10 Essential (primary) hypertension: Secondary | ICD-10-CM

## 2018-05-21 DIAGNOSIS — Z125 Encounter for screening for malignant neoplasm of prostate: Secondary | ICD-10-CM

## 2018-05-21 DIAGNOSIS — Z6841 Body Mass Index (BMI) 40.0 and over, adult: Secondary | ICD-10-CM

## 2018-05-21 DIAGNOSIS — R7303 Prediabetes: Secondary | ICD-10-CM

## 2018-05-23 ENCOUNTER — Other Ambulatory Visit (INDEPENDENT_AMBULATORY_CARE_PROVIDER_SITE_OTHER): Payer: 59

## 2018-05-23 ENCOUNTER — Other Ambulatory Visit: Payer: 59

## 2018-05-23 DIAGNOSIS — Z125 Encounter for screening for malignant neoplasm of prostate: Secondary | ICD-10-CM

## 2018-05-23 DIAGNOSIS — Z6841 Body Mass Index (BMI) 40.0 and over, adult: Secondary | ICD-10-CM | POA: Diagnosis not present

## 2018-05-23 DIAGNOSIS — I10 Essential (primary) hypertension: Secondary | ICD-10-CM

## 2018-05-23 DIAGNOSIS — R7303 Prediabetes: Secondary | ICD-10-CM

## 2018-05-23 LAB — COMPREHENSIVE METABOLIC PANEL
ALBUMIN: 4 g/dL (ref 3.5–5.2)
ALK PHOS: 83 U/L (ref 39–117)
ALT: 36 U/L (ref 0–53)
AST: 29 U/L (ref 0–37)
BILIRUBIN TOTAL: 0.8 mg/dL (ref 0.2–1.2)
BUN: 20 mg/dL (ref 6–23)
CALCIUM: 9.3 mg/dL (ref 8.4–10.5)
CO2: 33 mEq/L — ABNORMAL HIGH (ref 19–32)
CREATININE: 0.98 mg/dL (ref 0.40–1.50)
Chloride: 102 mEq/L (ref 96–112)
GFR: 83.91 mL/min (ref >60.00)
Glucose, Bld: 109 mg/dL — ABNORMAL HIGH (ref 70–99)
Potassium: 4.5 mEq/L (ref 3.5–5.1)
Sodium: 139 mEq/L (ref 135–145)
TOTAL PROTEIN: 7 g/dL (ref 6.0–8.3)

## 2018-05-23 LAB — LIPID PANEL
CHOL/HDL RATIO: 4
CHOLESTEROL: 193 mg/dL (ref 0–200)
HDL: 45.7 mg/dL (ref >39.00)
LDL CALC: 128 mg/dL — AB (ref 0–99)
NonHDL: 147.71
Triglycerides: 101 mg/dL (ref 0.0–149.0)
VLDL: 20.2 mg/dL (ref 0.0–40.0)

## 2018-05-23 LAB — PSA: PSA: 3.21 ng/mL (ref 0.10–4.00)

## 2018-05-23 LAB — TSH: TSH: 0.71 u[IU]/mL (ref 0.35–4.50)

## 2018-05-23 LAB — HEMOGLOBIN A1C: HEMOGLOBIN A1C: 5.8 % (ref 4.6–6.5)

## 2018-05-30 ENCOUNTER — Encounter: Payer: Self-pay | Admitting: Family Medicine

## 2018-05-30 ENCOUNTER — Ambulatory Visit (INDEPENDENT_AMBULATORY_CARE_PROVIDER_SITE_OTHER): Payer: 59 | Admitting: Family Medicine

## 2018-05-30 VITALS — BP 120/76 | HR 63 | Temp 98.3°F | Ht 70.25 in | Wt 308.0 lb

## 2018-05-30 DIAGNOSIS — Z0000 Encounter for general adult medical examination without abnormal findings: Secondary | ICD-10-CM | POA: Diagnosis not present

## 2018-05-30 DIAGNOSIS — I10 Essential (primary) hypertension: Secondary | ICD-10-CM | POA: Diagnosis not present

## 2018-05-30 DIAGNOSIS — Z720 Tobacco use: Secondary | ICD-10-CM

## 2018-05-30 DIAGNOSIS — Z6841 Body Mass Index (BMI) 40.0 and over, adult: Secondary | ICD-10-CM

## 2018-05-30 DIAGNOSIS — R7303 Prediabetes: Secondary | ICD-10-CM | POA: Diagnosis not present

## 2018-05-30 DIAGNOSIS — I480 Paroxysmal atrial fibrillation: Secondary | ICD-10-CM

## 2018-05-30 DIAGNOSIS — Z1211 Encounter for screening for malignant neoplasm of colon: Secondary | ICD-10-CM | POA: Diagnosis not present

## 2018-05-30 NOTE — Assessment & Plan Note (Signed)
Encouraged healthy diet and lifestyle changes to affect sustainable weight loss.  

## 2018-05-30 NOTE — Patient Instructions (Addendum)
Pass by lab to pick up stool kit Continue limiting sugar and carbs in diet to keep blood sugar under control You are doing well today. Continue walking routine 30 min in the morning, work on healthy diet for continued weight loss.   Health Maintenance, Male A healthy lifestyle and preventive care is important for your health and wellness. Ask your health care provider about what schedule of regular examinations is right for you. What should I know about weight and diet? Eat a Healthy Diet  Eat plenty of vegetables, fruits, whole grains, low-fat dairy products, and lean protein.  Do not eat a lot of foods high in solid fats, added sugars, or salt.  Maintain a Healthy Weight Regular exercise can help you achieve or maintain a healthy weight. You should:  Do at least 150 minutes of exercise each week. The exercise should increase your heart rate and make you sweat (moderate-intensity exercise).  Do strength-training exercises at least twice a week.  Watch Your Levels of Cholesterol and Blood Lipids  Have your blood tested for lipids and cholesterol every 5 years starting at 57 years of age. If you are at high risk for heart disease, you should start having your blood tested when you are 57 years old. You may need to have your cholesterol levels checked more often if: ? Your lipid or cholesterol levels are high. ? You are older than 57 years of age. ? You are at high risk for heart disease.  What should I know about cancer screening? Many types of cancers can be detected early and may often be prevented. Lung Cancer  You should be screened every year for lung cancer if: ? You are a current smoker who has smoked for at least 30 years. ? You are a former smoker who has quit within the past 15 years.  Talk to your health care provider about your screening options, when you should start screening, and how often you should be screened.  Colorectal Cancer  Routine colorectal cancer  screening usually begins at 57 years of age and should be repeated every 5-10 years until you are 57 years old. You may need to be screened more often if early forms of precancerous polyps or small growths are found. Your health care provider may recommend screening at an earlier age if you have risk factors for colon cancer.  Your health care provider may recommend using home test kits to check for hidden blood in the stool.  A small camera at the end of a tube can be used to examine your colon (sigmoidoscopy or colonoscopy). This checks for the earliest forms of colorectal cancer.  Prostate and Testicular Cancer  Depending on your age and overall health, your health care provider may do certain tests to screen for prostate and testicular cancer.  Talk to your health care provider about any symptoms or concerns you have about testicular or prostate cancer.  Skin Cancer  Check your skin from head to toe regularly.  Tell your health care provider about any new moles or changes in moles, especially if: ? There is a change in a mole's size, shape, or color. ? You have a mole that is larger than a pencil eraser.  Always use sunscreen. Apply sunscreen liberally and repeat throughout the day.  Protect yourself by wearing long sleeves, pants, a wide-brimmed hat, and sunglasses when outside.  What should I know about heart disease, diabetes, and high blood pressure?  If you are 18-39 years of   age, have your blood pressure checked every 3-5 years. If you are 40 years of age or older, have your blood pressure checked every year. You should have your blood pressure measured twice-once when you are at a hospital or clinic, and once when you are not at a hospital or clinic. Record the average of the two measurements. To check your blood pressure when you are not at a hospital or clinic, you can use: ? An automated blood pressure machine at a pharmacy. ? A home blood pressure monitor.  Talk to your  health care provider about your target blood pressure.  If you are between 45-79 years old, ask your health care provider if you should take aspirin to prevent heart disease.  Have regular diabetes screenings by checking your fasting blood sugar level. ? If you are at a normal weight and have a low risk for diabetes, have this test once every three years after the age of 45. ? If you are overweight and have a high risk for diabetes, consider being tested at a younger age or more often.  A one-time screening for abdominal aortic aneurysm (AAA) by ultrasound is recommended for men aged 65-75 years who are current or former smokers. What should I know about preventing infection? Hepatitis B If you have a higher risk for hepatitis B, you should be screened for this virus. Talk with your health care provider to find out if you are at risk for hepatitis B infection. Hepatitis C Blood testing is recommended for:  Everyone born from 1945 through 1965.  Anyone with known risk factors for hepatitis C.  Sexually Transmitted Diseases (STDs)  You should be screened each year for STDs including gonorrhea and chlamydia if: ? You are sexually active and are younger than 57 years of age. ? You are older than 57 years of age and your health care provider tells you that you are at risk for this type of infection. ? Your sexual activity has changed since you were last screened and you are at an increased risk for chlamydia or gonorrhea. Ask your health care provider if you are at risk.  Talk with your health care provider about whether you are at high risk of being infected with HIV. Your health care provider may recommend a prescription medicine to help prevent HIV infection.  What else can I do?  Schedule regular health, dental, and eye exams.  Stay current with your vaccines (immunizations).  Do not use any tobacco products, such as cigarettes, chewing tobacco, and e-cigarettes. If you need help  quitting, ask your health care provider.  Limit alcohol intake to no more than 2 drinks per day. One drink equals 12 ounces of beer, 5 ounces of wine, or 1 ounces of hard liquor.  Do not use street drugs.  Do not share needles.  Ask your health care provider for help if you need support or information about quitting drugs.  Tell your health care provider if you often feel depressed.  Tell your health care provider if you have ever been abused or do not feel safe at home. This information is not intended to replace advice given to you by your health care provider. Make sure you discuss any questions you have with your health care provider. Document Released: 05/19/2008 Document Revised: 07/20/2016 Document Reviewed: 08/25/2015 Elsevier Interactive Patient Education  2018 [employer] Inc[employer]  

## 2018-05-30 NOTE — Assessment & Plan Note (Signed)
A1c elevated this year - reviewed avoiding added sugars and simple carbs in diet.

## 2018-05-30 NOTE — Assessment & Plan Note (Signed)
Preventative protocols reviewed and updated unless pt declined. Discussed healthy diet and lifestyle.  

## 2018-05-30 NOTE — Assessment & Plan Note (Addendum)
Continue to encourage full cessation. Does not meet criteria for lung cancer screening.

## 2018-05-30 NOTE — Assessment & Plan Note (Signed)
Chronic, stable. Continue metoprolol. 

## 2018-05-30 NOTE — Assessment & Plan Note (Addendum)
Regular today. Continue aspirin. Continue metoprolol 50mg  daily. Has f/u with cards planned

## 2018-05-30 NOTE — Progress Notes (Signed)
BP 120/76 (BP Location: Left Arm, Patient Position: Sitting, Cuff Size: Large)   Pulse 63   Temp 98.3 F (36.8 C) (Oral)   Ht 5' 10.25" (1.784 m)   Wt (!) 308 lb (139.7 kg)   SpO2 98%   BMI 43.88 kg/m    CC: CPE Subjective:    Patient ID: Eric Salazar, male    DOB: 22-May-1961, 57 y.o.   MRN: [id number]  HPI: Eric Salazar is a 57 y.o. male presenting on 05/30/2018 for Annual Exam   11 lb weight loss in last 3 months - stopped alcohol. Limiting sugar in diet.   Preventative: Colon cancer screening - iFOB neg 12/2015. requests repeat Prostate cancer screening - discussed. Nocturia x1, strong stream. Agrees to screen today.  Lung cancer screening - 40 yr history, about 1/2 ppd.  Flu - declines Tdap 07/2011  Seat belt use discussed  Sunscreen use discussed. 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 Edu: 2 years college Activity: walking 30 min/day in am  Diet: good water, vegetables daily, working on healthy diet changes - sugar free cookies.   Relevant past medical, surgical, family and social history reviewed and updated as indicated. Interim medical history since our last visit reviewed. Allergies and medications reviewed and updated. Outpatient Medications Prior to Visit  Medication Sig Dispense Refill  . aspirin EC 81 MG tablet Take 81 mg by mouth daily.    . metoprolol tartrate (LOPRESSOR) 50 MG tablet Take 1 tablet (50 mg total) by mouth 2 (two) times daily. 60 tablet 6  . Multiple Vitamin (MULTIVITAMIN WITH MINERALS) TABS tablet Take 1 tablet by mouth daily.    . Potassium 99 MG TABS Take 2 tablets by mouth daily.    . vitamin C (ASCORBIC ACID) 500 MG tablet Take 500 mg by mouth daily.      . predniSONE (DELTASONE) 20 MG tablet Take two tablets daily  for 4 days followed by one tablet daily for 4 days 12 tablet 0   No facility-administered medications prior to visit.      Per HPI unless specifically indicated in ROS section below Review of Systems  Constitutional: Negative for activity change, appetite change, chills, fatigue, fever and unexpected weight change.  HENT: Negative for hearing loss.   Eyes: Negative for visual disturbance.  Respiratory: Negative for cough, chest tightness, shortness of breath and wheezing.   Cardiovascular: Negative for chest pain, palpitations and leg swelling.  Gastrointestinal: Negative for abdominal distention, abdominal pain, blood in stool, constipation, diarrhea, nausea and vomiting.  Genitourinary: Negative for difficulty urinating and hematuria.  Musculoskeletal: Negative for arthralgias, myalgias and neck pain.  Skin: Negative for rash.  Neurological: Negative for dizziness, seizures, syncope and headaches.  Hematological: Negative for adenopathy. Does not bruise/bleed easily.  Psychiatric/Behavioral: Negative for dysphoric mood. The patient is not nervous/anxious.        Objective:    BP 120/76 (BP Location: Left Arm, Patient Position: Sitting, Cuff Size: Large)   Pulse 63   Temp 98.3 F (36.8 C) (Oral)   Ht 5' 10.25" (1.784 m)   Wt (!) 308 lb (139.7 kg)   SpO2 98%   BMI 43.88 kg/m   Wt Readings from Last 3 Encounters:  05/30/18 (!) 308 lb (139.7  kg)  02/21/18 (!) 319 lb (144.7 kg)  01/24/18 (!) 320 lb (145.2 kg)    Physical Exam  Constitutional: He is oriented to person, place, and time. He appears well-developed and well-nourished. No distress.  HENT:  Head: Normocephalic and atraumatic.  Right Ear: Hearing, tympanic membrane, external ear and ear canal normal.  Left Ear: Hearing, tympanic membrane, external ear and ear canal normal.  Nose: Nose normal.  Mouth/Throat: Uvula is midline, oropharynx is clear and moist and mucous membranes are normal. No oropharyngeal exudate,  posterior oropharyngeal edema or posterior oropharyngeal erythema.  Eyes: Pupils are equal, round, and reactive to light. Conjunctivae and EOM are normal. No scleral icterus.  Neck: Normal range of motion. Neck supple. No thyromegaly present.  Cardiovascular: Normal rate, regular rhythm, normal heart sounds and intact distal pulses.  No murmur heard. Pulses:      Radial pulses are 2+ on the right side, and 2+ on the left side.  Pulmonary/Chest: Effort normal and breath sounds normal. No respiratory distress. He has no wheezes. He has no rales.  Abdominal: Soft. Bowel sounds are normal. He exhibits no distension and no mass. There is no tenderness. There is no rebound and no guarding.  Genitourinary: Rectum normal and prostate normal. Rectal exam shows no external hemorrhoid, no internal hemorrhoid, no fissure, no mass, no tenderness and anal tone normal. Prostate is not enlarged (20gm) and not tender.  Musculoskeletal: Normal range of motion. He exhibits no edema.  Lymphadenopathy:    He has no cervical adenopathy.  Neurological: He is alert and oriented to person, place, and time.  CN grossly intact, station and gait intact  Skin: Skin is warm and dry. No rash noted.  Psychiatric: He has a normal mood and affect. His behavior is normal. Judgment and thought content normal.  Nursing note and vitals reviewed.  Results for orders placed or performed in visit on 05/23/18  Comprehensive metabolic panel  Result Value Ref Range   Sodium 139 135 - 145 mEq/L   Potassium 4.5 3.5 - 5.1 mEq/L   Chloride 102 96 - 112 mEq/L   CO2 33 (H) 19 - 32 mEq/L   Glucose, Bld 109 (H) 70 - 99 mg/dL   BUN 20 6 - 23 mg/dL   Creatinine, Ser 8.650.98 0.40 - 1.50 mg/dL   Total Bilirubin 0.8 0.2 - 1.2 mg/dL   Alkaline Phosphatase 83 39 - 117 U/L   AST 29 0 - 37 U/L   ALT 36 0 - 53 U/L   Total Protein 7.0 6.0 - 8.3 g/dL   Albumin 4.0 3.5 - 5.2 g/dL   Calcium 9.3 8.4 - 78.410.5 mg/dL   GFR 69.6283.91 >95.28>60.00 mL/min  TSH    Result Value Ref Range   TSH 0.71 0.35 - 4.50 uIU/mL  Hemoglobin A1c  Result Value Ref Range   Hgb A1c MFr Bld 5.8 4.6 - 6.5 %  PSA  Result Value Ref Range   PSA 3.21 0.10 - 4.00 ng/mL  Lipid panel  Result Value Ref Range   Cholesterol 193 0 - 200 mg/dL   Triglycerides 413.2101.0 0.0 - 149.0 mg/dL   HDL 44.0145.70 >02.72>39.00 mg/dL   VLDL 53.620.2 0.0 - 64.440.0 mg/dL   LDL Cholesterol 034128 (H) 0 - 99 mg/dL   Total CHOL/HDL Ratio 4    NonHDL 147.71       Assessment & Plan:   Problem List Items Addressed This Visit    Tobacco use    Continue to encourage full  cessation. Does not meet criteria for lung cancer screening.       Prediabetes    A1c elevated this year - reviewed avoiding added sugars and simple carbs in diet.      Paroxysmal atrial fibrillation (HCC)    Regular today. Continue aspirin. Continue metoprolol 50mg  daily. Has f/u with cards planned      Healthcare maintenance - Primary    Preventative protocols reviewed and updated unless pt declined. Discussed healthy diet and lifestyle.       Essential hypertension    Chronic, stable. Continue metoprolol.      Body mass index (BMI) 40.0-44.9, adult (HCC)    Encouraged healthy diet and lifestyle changes to affect sustainable weight loss.        Other Visit Diagnoses    Special screening for malignant neoplasms, colon       Relevant Orders   Fecal occult blood, imunochemical       No orders of the defined types were placed in this encounter.  Orders Placed This Encounter  Procedures  . Fecal occult blood, imunochemical    Standing Status:   Future    Standing Expiration Date:   05/31/2019    Follow up plan: Return in about 1 year (around 05/31/2019) for annual exam, prior fasting for blood work.  Eustaquio Boyden, MD

## 2018-08-26 ENCOUNTER — Other Ambulatory Visit: Payer: Self-pay | Admitting: Family Medicine

## 2018-09-24 ENCOUNTER — Ambulatory Visit (INDEPENDENT_AMBULATORY_CARE_PROVIDER_SITE_OTHER): Payer: BLUE CROSS/BLUE SHIELD | Admitting: Family Medicine

## 2018-09-24 ENCOUNTER — Encounter: Payer: Self-pay | Admitting: Family Medicine

## 2018-09-24 VITALS — BP 136/76 | HR 74 | Temp 98.5°F | Ht 70.25 in | Wt 306.8 lb

## 2018-09-24 DIAGNOSIS — L03116 Cellulitis of left lower limb: Secondary | ICD-10-CM

## 2018-09-24 DIAGNOSIS — L02416 Cutaneous abscess of left lower limb: Secondary | ICD-10-CM

## 2018-09-24 HISTORY — DX: Cellulitis of left lower limb: L03.116

## 2018-09-24 HISTORY — DX: Cutaneous abscess of left lower limb: L02.416

## 2018-09-24 MED ORDER — DOXYCYCLINE HYCLATE 100 MG PO TABS: 100.0000 mg | ORAL_TABLET | Freq: Two times a day (BID) | ORAL | 0 refills | Status: DC

## 2018-09-24 NOTE — Progress Notes (Signed)
BP 136/76 (BP Location: Left Arm, Patient Position: Sitting, Cuff Size: Large)   Pulse 74   Temp 98.5 F (36.9 C) (Oral)   Ht 5' 10.25" (1.784 m)   Wt (!) 306 lb 12 oz (139.1 kg)   SpO2 97%   BMI 43.70 kg/m    CC: skin lesion Subjective:    Patient ID: Eric Salazar, male    DOB: 06/02/1961, 57 y.o.   MRN: [id number]  HPI: Eric Salazar is a 57 y.o. male presenting on 09/24/2018 for Skin Lesion (C/o a sore on anterior lower left leg. Noticed about 2 wks. Started like a pimple/blackhead. Area has increased in size and discomfort. )   Several month h/o lesion anterior L lower leg mid shin. May have started after blackhead. Redness/warmth and darker scabbed area. Waxes and wanes, intermittent erythema. Has not tried anything for this. Uncomfortable to touch. No bug bites endorsed.   No fevers/chills, nausea, vomiting, abd pain. No other skin lesions.   Relevant past medical, surgical, family and social history reviewed and updated as indicated. Interim medical history since our last visit reviewed. Allergies and medications reviewed and updated. Outpatient Medications Prior to Visit  Medication Sig Dispense Refill  . aspirin EC 81 MG tablet Take 81 mg by mouth daily.    . metoprolol tartrate (LOPRESSOR) 50 MG tablet TAKE 1 TABLET BY MOUTH TWICE A DAY 90 tablet 3  . Multiple Vitamin (MULTIVITAMIN WITH MINERALS) TABS tablet Take 1 tablet by mouth daily.    . Potassium 99 MG TABS Take 2 tablets by mouth daily.    . vitamin C (ASCORBIC ACID) 500 MG tablet Take 500 mg by mouth daily.       No facility-administered medications prior to visit.      Per HPI unless specifically indicated in ROS section below Review of Systems     Objective:    BP 136/76 (BP Location: Left Arm, Patient Position: Sitting, Cuff Size: Large)   Pulse 74   Temp 98.5 F (36.9 C) (Oral)   Ht 5' 10.25" (1.784 m)   Wt (!) 306 lb 12 oz (139.1 kg)   SpO2 97%   BMI 43.70 kg/m   Wt Readings from  Last 3 Encounters:  09/24/18 (!) 306 lb 12 oz (139.1 kg)  05/30/18 (!) 308 lb (139.7 kg)  02/21/18 (!) 319 lb (144.7 kg)    Physical Exam  Constitutional: He appears well-developed and well-nourished. No distress.  Musculoskeletal: Normal range of motion.  Skin: Skin is warm and dry. There is erythema.     L anterior leg at mid shin with erythema that was delineated with skin marker, central scab, peeling of skin, some pustular drainage able to be expressed and collected for wound culture  Nursing note and vitals reviewed.  Results for orders placed or performed in visit on 05/23/18  Comprehensive metabolic panel  Result Value Ref Range   Sodium 139 135 - 145 mEq/L   Potassium 4.5 3.5 - 5.1 mEq/L   Chloride 102 96 - 112 mEq/L   CO2 33 (H) 19 - 32 mEq/L   Glucose, Bld 109 (H) 70 - 99 mg/dL   BUN 20 6 - 23 mg/dL   Creatinine, Ser 4.09 0.40 - 1.50 mg/dL   Total Bilirubin 0.8 0.2 - 1.2 mg/dL   Alkaline Phosphatase 83 39 - 117 U/L   AST 29 0 - 37 U/L   ALT 36 0 - 53 U/L   Total Protein 7.0 6.0 -  8.3 g/dL   Albumin 4.0 3.5 - 5.2 g/dL   Calcium 9.3 8.4 - 16.1 mg/dL   GFR 09.60 >45.40 mL/min  TSH  Result Value Ref Range   TSH 0.71 0.35 - 4.50 uIU/mL  Hemoglobin A1c  Result Value Ref Range   Hgb A1c MFr Bld 5.8 4.6 - 6.5 %  PSA  Result Value Ref Range   PSA 3.21 0.10 - 4.00 ng/mL  Lipid panel  Result Value Ref Range   Cholesterol 193 0 - 200 mg/dL   Triglycerides 981.1 0.0 - 149.0 mg/dL   HDL 91.47 >82.95 mg/dL   VLDL 62.1 0.0 - 30.8 mg/dL   LDL Cholesterol 657 (H) 0 - 99 mg/dL   Total CHOL/HDL Ratio 4    NonHDL 147.71       Assessment & Plan:   Problem List Items Addressed This Visit    Cellulitis and abscess of left leg - Primary    Anticipate purulent cellulitis Wound culture collected Rx doxycycline 10d course. Home care instructions provided -rec regular warm compresses Update if not improving with treatment. Pt agrees with plan.       Relevant Orders    WOUND CULTURE       Meds ordered this encounter  Medications  . doxycycline (VIBRA-TABS) 100 MG tablet    Sig: Take 1 tablet (100 mg total) by mouth 2 (two) times daily.    Dispense:  20 tablet    Refill:  0   Orders Placed This Encounter  Procedures  . WOUND CULTURE    Order Specific Question:   Source    Answer:   L anterior shin    Follow up plan: Return if symptoms worsen or fail to improve.  Eustaquio Boyden, MD

## 2018-09-24 NOTE — Assessment & Plan Note (Signed)
Anticipate purulent cellulitis Wound culture collected Rx doxycycline 10d course. Home care instructions provided -rec regular warm compresses Update if not improving with treatment. Pt agrees with plan.

## 2018-09-24 NOTE — Patient Instructions (Addendum)
You have cellulitis of the left leg and possible small abscess.  Use warm compresses 2-3 times a day for next several days. Treat with antibiotic sent to pharmacy (doxycyclin for 10 days).   Cellulitis, Adult Cellulitis is a skin infection. The infected area is usually red and sore. This condition occurs most often in the arms and lower legs. It is very important to get treated for this condition. Follow these instructions at home:  Take over-the-counter and prescription medicines only as told by your doctor.  If you were prescribed an antibiotic medicine, take it as told by your doctor. Do not stop taking the antibiotic even if you start to feel better.  Drink enough fluid to keep your pee (urine) clear or pale yellow.  Do not touch or rub the infected area.  Raise (elevate) the infected area above the level of your heart while you are sitting or lying down.  Place warm or cold wet cloths (warm or cold compresses) on the infected area. Do this as told by your doctor.  Keep all follow-up visits as told by your doctor. This is important. These visits let your doctor make sure your infection is not getting worse. Contact a doctor if:  You have a fever.  Your symptoms do not get better after 1-2 days of treatment.  Your bone or joint under the infected area starts to hurt after the skin has healed.  Your infection comes back. This can happen in the same area or another area.  You have a swollen bump in the infected area.  You have new symptoms.  You feel ill and also have muscle aches and pains. Get help right away if:  Your symptoms get worse.  You feel very sleepy.  You throw up (vomit) or have watery poop (diarrhea) for a long time.  There are red streaks coming from the infected area.  Your red area gets larger.  Your red area turns darker. This information is not intended to replace advice given to you by your health care provider. Make sure you discuss any  questions you have with your health care provider. Document Released: 05/09/2008 Document Revised: 04/28/2016 Document Reviewed: 09/30/2015 Elsevier Interactive Patient Education  2018 [employer].

## 2018-09-27 LAB — WOUND CULTURE
GRAM STAIN: NONE SEEN
MICRO NUMBER: 91262287
SPECIMEN QUALITY: ADEQUATE

## 2019-01-14 ENCOUNTER — Other Ambulatory Visit: Payer: Self-pay | Admitting: Family Medicine

## 2019-02-04 ENCOUNTER — Telehealth: Payer: Self-pay | Admitting: Family Medicine

## 2019-02-04 ENCOUNTER — Telehealth: Payer: Self-pay | Admitting: Cardiovascular Disease

## 2019-02-04 NOTE — Telephone Encounter (Signed)
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 Please advise.

## 2019-02-04 NOTE — Telephone Encounter (Signed)
Pt needs letter stating that his medication Metoprolol is keeping his afib under control. Please call to discuss.

## 2019-02-04 NOTE — Telephone Encounter (Signed)
Spoke with patient. 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  He was last seen by Dr Mariah Milling 03/2017. He is now scheduled to see Alycia Rossetti later this month. Encouraged patient to keep appointment for further evaluation and then provider to decide on appropriate letter. He was appreciative. Stated he has a call into PCP as well.  If they can take care of this for him, then he will call and cancel appointment with [place]. Patient denies any cardiac concerns at this time.

## 2019-02-11 NOTE — Telephone Encounter (Signed)
He is due for f/u with cards. I see he has appt at end of month. I do want him to touch base with cards about this. Thanks.

## 2019-02-11 NOTE — Telephone Encounter (Signed)
Left message on vm per dpr relaying Dr. G's message.  

## 2019-02-15 ENCOUNTER — Telehealth: Payer: Self-pay

## 2019-02-15 NOTE — Telephone Encounter (Signed)
Updated pt's pharmacy list per request. 

## 2019-02-16 ENCOUNTER — Encounter: Payer: Self-pay | Admitting: Family Medicine

## 2019-02-16 MED ORDER — METOPROLOL TARTRATE 50 MG PO TABS: 50.0000 mg | ORAL_TABLET | Freq: Two times a day (BID) | ORAL | 1 refills | Status: DC

## 2019-02-16 MED ORDER — METOPROLOL TARTRATE 50 MG PO TABS: 50.0000 mg | ORAL_TABLET | Freq: Two times a day (BID) | ORAL | 0 refills | Status: DC

## 2019-02-17 NOTE — Progress Notes (Deleted)
   Cardiology Office Note Date:  02/17/2019  Patient ID:  Eric Salazar, Eric Salazar Jan 28, 1961, MRN [id number] PCP:  Eustaquio Boyden, MD  Cardiologist:  Dr. Mariah Milling, MD  ***refresh   Chief Complaint: Follow up  History of Present Illness: Eric Salazar is a 58 y.o. male with history of ***   Past Medical History:  Diagnosis Date  . History of tobacco abuse    a.Quit 2016.  . Morbid obesity (HCC)   . Paroxysmal atrial fibrillation (HCC)    a. 12/2015 Echo: EF 60-65%, no rwma; b. Converted on oral BB; c. CHA2DS2VASc = 0-->on ASA 81.  . Precordial chest pain    a. 12/2015 in setting of AF RVR;  b. 12/2015 MV: low risk, EF 55-65%.  . Prediabetes     Past Surgical History:  Procedure Laterality Date  . CARDIOVASCULAR STRESS TEST  12/2015   low risk study, EF 55-65%  . DOBUTAMINE STRESS ECHO  01/2011   for typical/atypical chest pain - good tolerance, 11 METS, no significant abnormalities  . MRI lumbar  2008   disc protrusion L4/5 with impingement L5 and probably L4 nerve roots    No outpatient medications have been marked as taking for the 03/01/19 encounter (Appointment) with Sondra Barges, PA-C.    Allergies:   Patient has no known allergies.   Social History:  The patient  reports that he has been smoking cigarettes. He has never used smokeless tobacco. He reports current alcohol use of about 3.0 standard drinks of alcohol per week. He reports that he does not use drugs.   Family History:  The patient's family history includes Coronary artery disease (age of onset: 81) in his mother; Diabetes in his mother; Healthy in his sister; Heart attack in his mother; Hypertension in his father and mother.  ROS:   ROS   PHYSICAL EXAM: *** VS:  There were no vitals taken for this visit. BMI: There is no height or weight on file to calculate BMI.  Physical Exam   EKG:  Was ordered and interpreted by me today. Shows ***  Recent Labs: 05/23/2018: ALT 36; BUN 20; Creatinine, Ser 0.98;  Potassium 4.5; Sodium 139; TSH 0.71  05/23/2018: Cholesterol 193; HDL 45.70; LDL Cholesterol 128; Total CHOL/HDL Ratio 4; Triglycerides 101.0; VLDL 20.2   CrCl cannot be calculated (Patient's most recent lab result is older than the maximum 21 days allowed.).   Wt Readings from Last 3 Encounters:  09/24/18 (!) 306 lb 12 oz (139.1 kg)  05/30/18 (!) 308 lb (139.7 kg)  02/21/18 (!) 319 lb (144.7 kg)     Other studies reviewed: Additional studies/records reviewed today include: summarized above  ASSESSMENT AND PLAN:  1. ***  Disposition: F/u with Dr. Mariah Milling or an APP in ***.  Current medicines are reviewed at length with the patient today.  The patient did not have any concerns regarding medicines.  Signed, Eula Listen, PA-C 02/17/2019 3:04 PM     [facility] [place] [address] [place], [place] [address] [phone]

## 2019-02-18 NOTE — Telephone Encounter (Signed)
ERROR

## 2019-02-27 ENCOUNTER — Telehealth: Payer: Self-pay | Admitting: Physician Assistant

## 2019-02-27 NOTE — Telephone Encounter (Signed)
Message received from [place], [place]. The patient was scheduled to see him on 3/27 in clinic for follow up. The patient called to cancel due to his work schedule. Ryan wanted to see if the patient would be interested in setting up a telephone e-visit with him.  I attempted to call the patient to assess if he would like to schedule a phone visit with Alycia Rossetti at this time.  I asked that he call back to confirm.

## 2019-03-01 ENCOUNTER — Ambulatory Visit: Payer: BLUE CROSS/BLUE SHIELD | Admitting: Physician Assistant

## 2019-03-04 NOTE — Telephone Encounter (Signed)
To scheduling pool to see if they can reach out with the patient for follow up.   No consent has been sent to the patient.

## 2019-03-05 NOTE — Telephone Encounter (Signed)
lmov to schedule Evisit with Alycia Rossetti

## 2019-04-17 IMAGING — CT CT HEART SCORING
2 series · 16 of 20 positions shown, 18 images · non-contrast
Comparison: None.

CLINICAL DATA: Risk stratification

EXAM:
Coronary Calcium Score
TECHNIQUE: The patient was scanned on a Siemens Somatom 64 slice scanner. Axial
non-contrast 3 mm slices were carried out through the heart. The
data set was analyzed on a dedicated work station and scored using
the Agatson method.

[Series 2: casc 3.0 i36f 2 bestdiast 73 % · axial · 0.43mm/px · z∈[-241,-136]mm · 8 of 47 slices shown, 10 images]
[im 6/47  vessel]
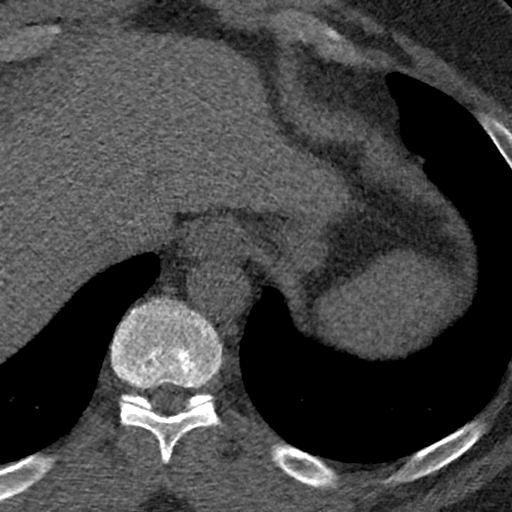
[im 6/47  lung]
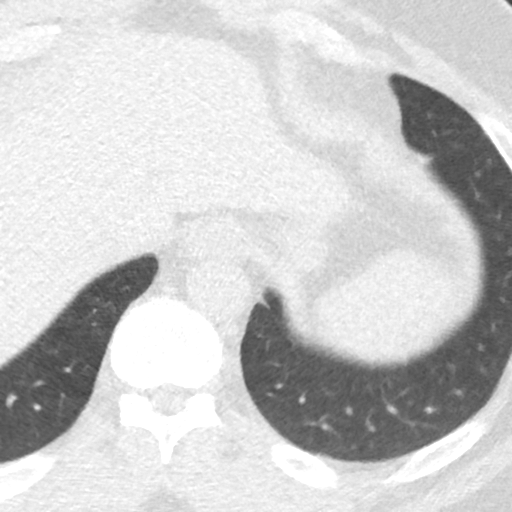
[im 11/47  vessel]
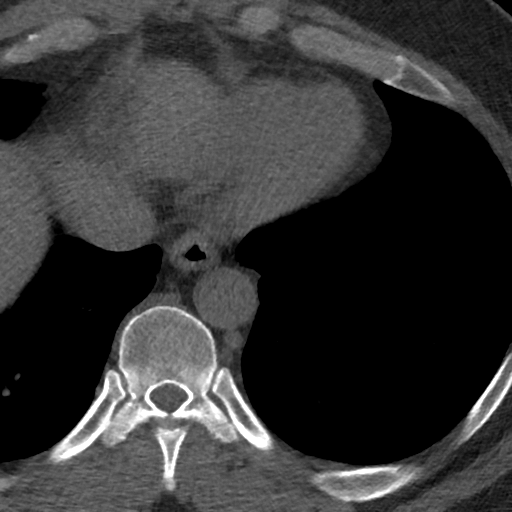
[im 16/47  vessel]
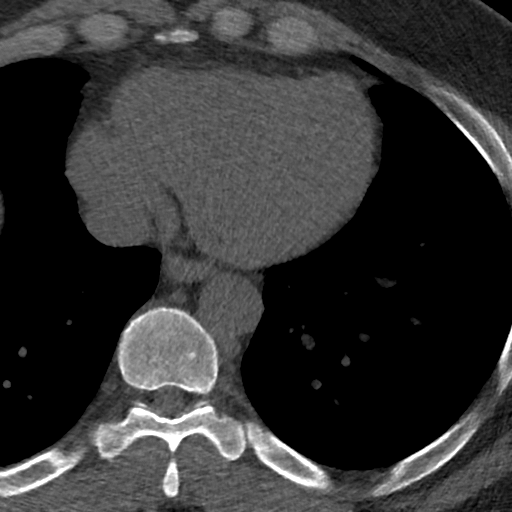
[im 21/47  vessel]
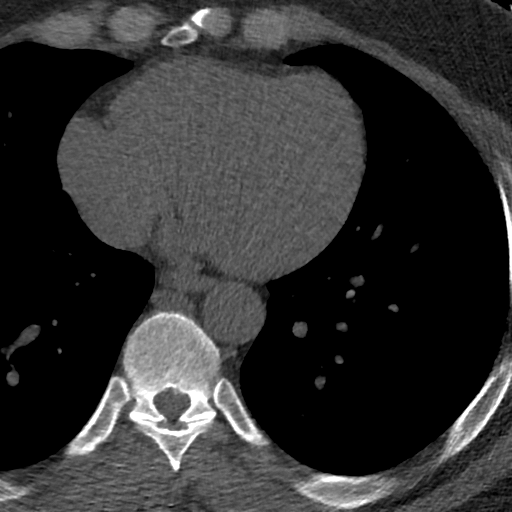
[im 26/47  vessel]
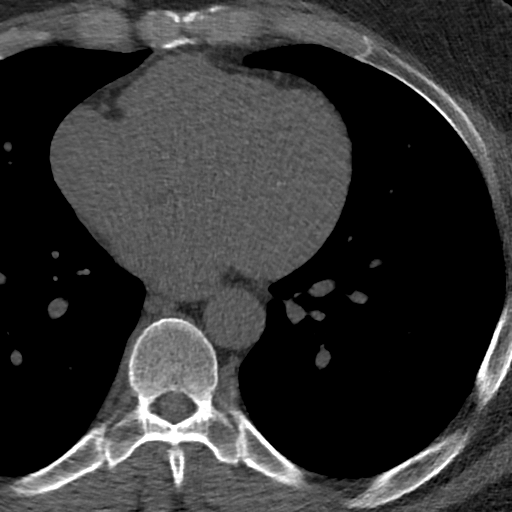
[im 26/47  lung]
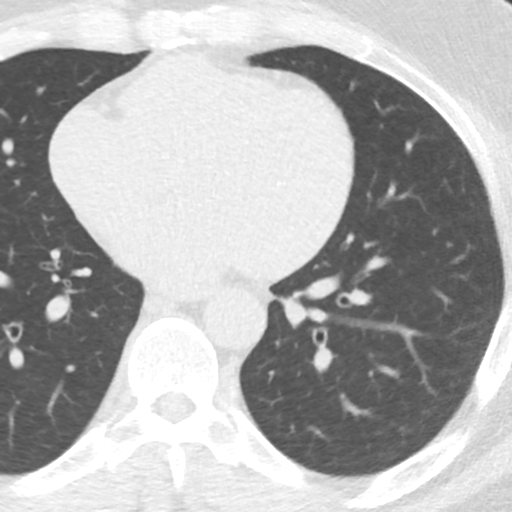
[im 31/47  vessel]
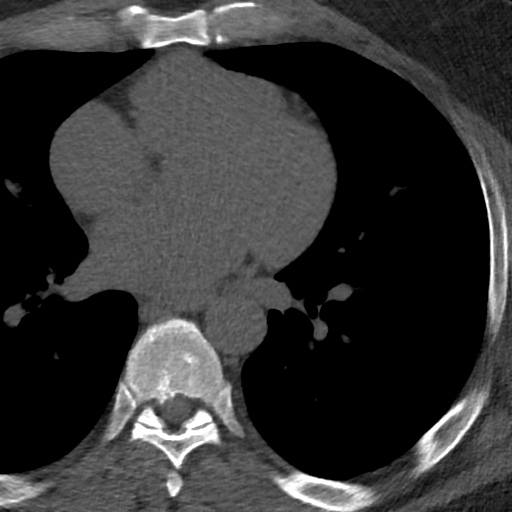
[im 36/47  vessel]
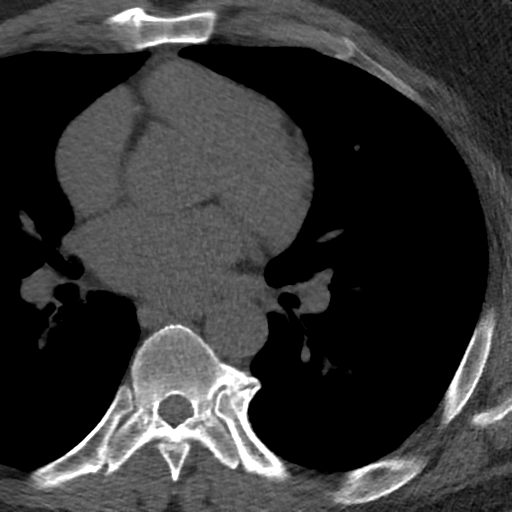
[im 41/47  vessel]
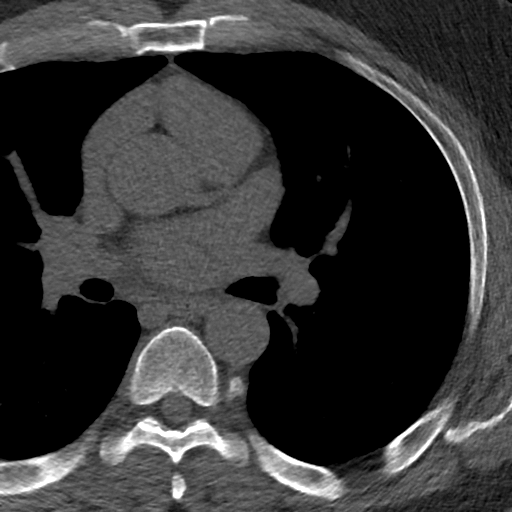

[Series 4: lung st 73 % · axial · 0.76mm/px · z∈[-241,-136]mm · 8 of 47 slices shown]
[im 6/47  lung]
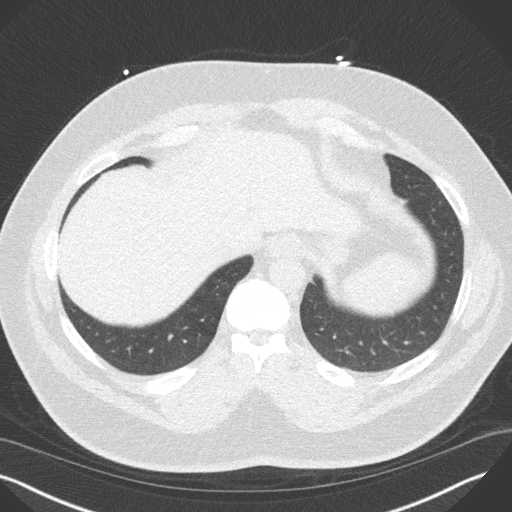
[im 11/47  lung]
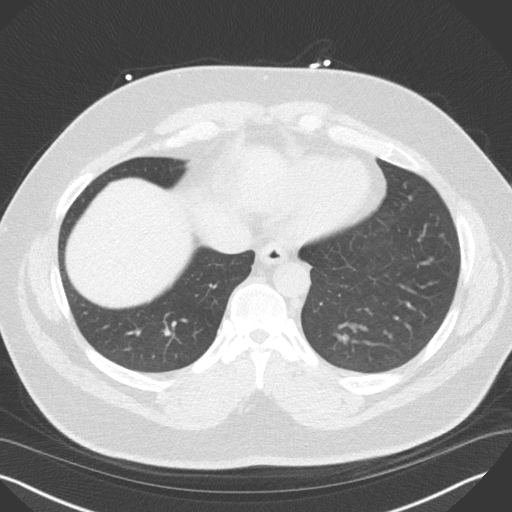
[im 16/47  lung]
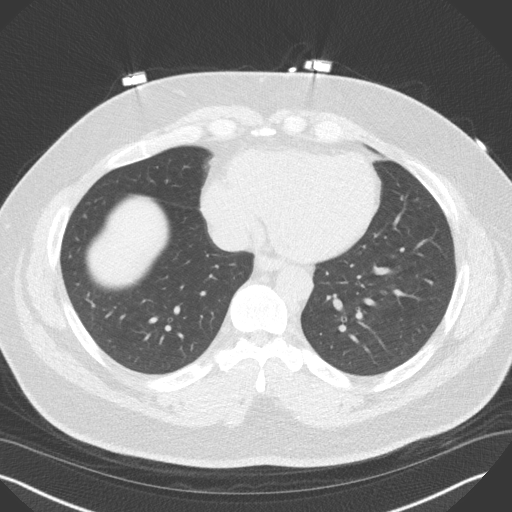
[im 21/47  lung]
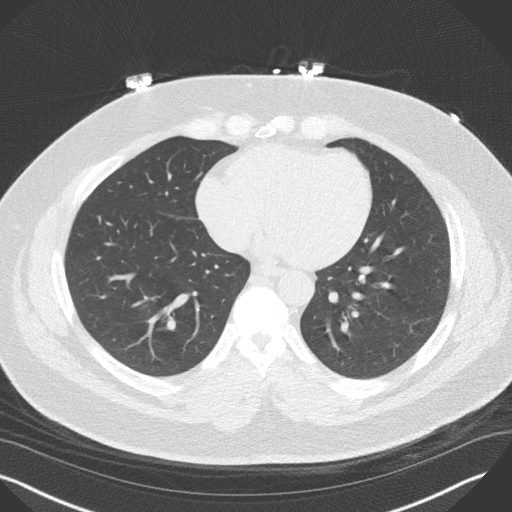
[im 26/47  lung]
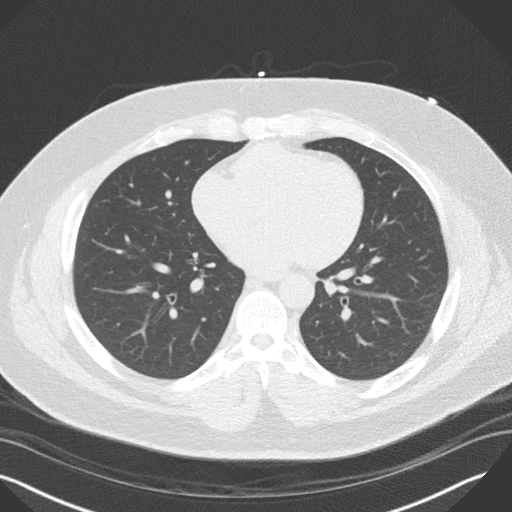
[im 31/47  lung]
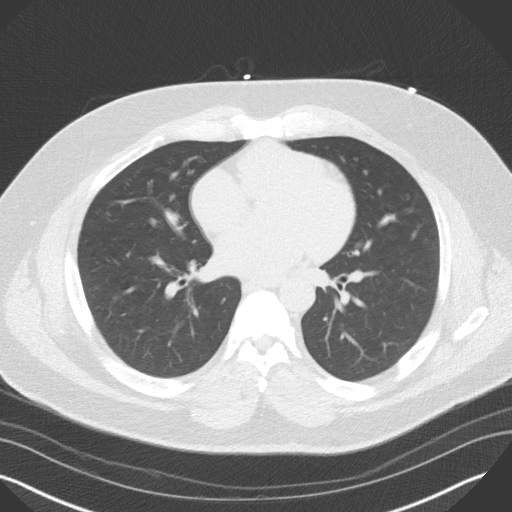
[im 36/47  lung]
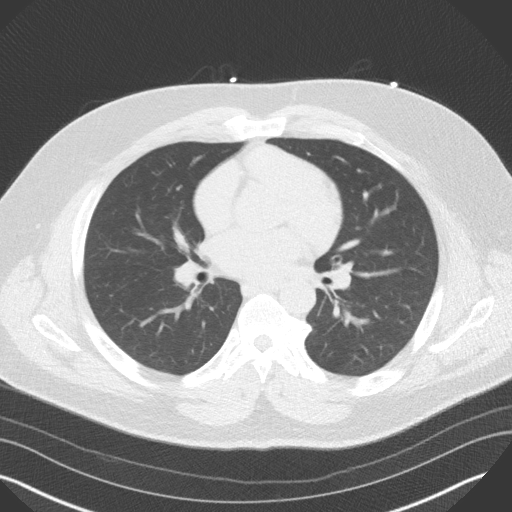
[im 41/47  lung]
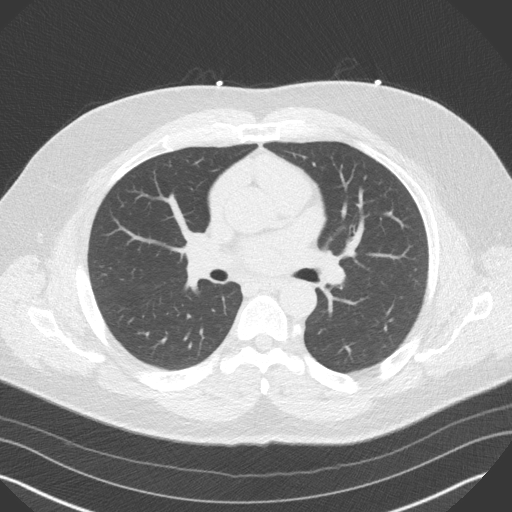

[16 of 20 positions shown; findings below may reference images not displayed]

FINDINGS: Non-cardiac: See separate report from [REDACTED].

Ascending Aorta:  3.7 cm

Pericardium: Normal

Coronary arteries: Isolated punctate calcium noted in mid and distal
LAD
IMPRESSION: Coronary calcium score of 11. This was 54th percentile for age and
sex matched control.

Benrabah Etoil

EXAM:
OVER-READ INTERPRETATION  CT CHEST

The following report is an over-read performed by radiologist Dr.
Saifon Kro [REDACTED] on 06/29/2017. This over-read
does not include interpretation of cardiac or coronary anatomy or
pathology. The coronary calcium score interpretation by the
cardiologist is attached.
FINDINGS: Cardiovascular: Heart and visualized aorta normal caliber.

Mediastinum/Nodes: No adenopathy in the lower mediastinum or hila.

Lungs/Pleura: Visualized lungs clear.

Upper Abdomen: Imaging into the upper abdomen shows no acute
findings.

Musculoskeletal: Chest wall soft tissues are unremarkable. No bony
abnormality.
IMPRESSION: No acute or significant extracardiac abnormality.

## 2019-05-01 ENCOUNTER — Telehealth: Payer: Self-pay

## 2019-05-01 NOTE — Telephone Encounter (Signed)
Called patient from recall.  No answer. LMOV.  This is the third attempt per recall list.  Will delete from recall.

## 2019-06-27 NOTE — Telephone Encounter (Signed)
No ans no vm   °

## 2019-07-07 ENCOUNTER — Telehealth: Payer: Self-pay | Admitting: Family Medicine

## 2019-07-08 NOTE — Telephone Encounter (Signed)
LVM for pt to call to schedule cpe and labs

## 2019-07-08 NOTE — Telephone Encounter (Signed)
E-scribed refill.  Plz schedule CPE and labs. 

## 2019-07-19 NOTE — Telephone Encounter (Signed)
lvm 2nd attempt

## 2019-08-05 NOTE — Telephone Encounter (Signed)
Pt called back and scheduled CPE on 08/07/19

## 2019-08-07 ENCOUNTER — Other Ambulatory Visit: Payer: Self-pay

## 2019-08-07 ENCOUNTER — Encounter: Payer: Self-pay | Admitting: Family Medicine

## 2019-08-07 ENCOUNTER — Ambulatory Visit (INDEPENDENT_AMBULATORY_CARE_PROVIDER_SITE_OTHER): Payer: BC Managed Care – PPO | Admitting: Family Medicine

## 2019-08-07 VITALS — BP 128/80 | HR 69 | Temp 97.8°F | Ht 70.0 in | Wt 299.0 lb

## 2019-08-07 DIAGNOSIS — Z720 Tobacco use: Secondary | ICD-10-CM

## 2019-08-07 DIAGNOSIS — Z0000 Encounter for general adult medical examination without abnormal findings: Secondary | ICD-10-CM

## 2019-08-07 DIAGNOSIS — Z1211 Encounter for screening for malignant neoplasm of colon: Secondary | ICD-10-CM | POA: Diagnosis not present

## 2019-08-07 DIAGNOSIS — R7303 Prediabetes: Secondary | ICD-10-CM

## 2019-08-07 DIAGNOSIS — I10 Essential (primary) hypertension: Secondary | ICD-10-CM

## 2019-08-07 DIAGNOSIS — Z125 Encounter for screening for malignant neoplasm of prostate: Secondary | ICD-10-CM | POA: Diagnosis not present

## 2019-08-07 DIAGNOSIS — I480 Paroxysmal atrial fibrillation: Secondary | ICD-10-CM

## 2019-08-07 DIAGNOSIS — I739 Peripheral vascular disease, unspecified: Secondary | ICD-10-CM

## 2019-08-07 DIAGNOSIS — E785 Hyperlipidemia, unspecified: Secondary | ICD-10-CM | POA: Insufficient documentation

## 2019-08-07 DIAGNOSIS — Z6841 Body Mass Index (BMI) 40.0 and over, adult: Secondary | ICD-10-CM

## 2019-08-07 MED ORDER — METOPROLOL TARTRATE 50 MG PO TABS: 50.0000 mg | ORAL_TABLET | Freq: Two times a day (BID) | ORAL | 3 refills | Status: DC

## 2019-08-07 NOTE — Assessment & Plan Note (Signed)
Question of this by recent life screen - denies claudication symptoms. Will continue to monitor.

## 2019-08-07 NOTE — Assessment & Plan Note (Addendum)
Update FLP off statin.  The 10-year ASCVD risk score Mikey Bussing DC Brooke Bonito., et al., 2013) is: 14%   Values used to calculate the score:     Age: 59 years     Sex: Male     Is Non-Hispanic African American: No     Diabetic: No     Tobacco smoker: Yes     Systolic Blood Pressure: 470 mmHg     Is BP treated: Yes     HDL Cholesterol: 45.7 mg/dL     Total Cholesterol: 193 mg/dL

## 2019-08-07 NOTE — Assessment & Plan Note (Signed)
Denies recurrent palpitations. On aspirin, beta blocker. Encouraged cards f/u as overdue.

## 2019-08-07 NOTE — Progress Notes (Signed)
This visit was conducted in person.  BP 128/80 (BP Location: Left Arm, Patient Position: Sitting, Cuff Size: Large)   Pulse 69   Temp 97.8 F (36.6 C) (Temporal)   Ht 5\' 10"  (1.778 m)   Wt 299 lb (135.6 kg)   SpO2 96%   BMI 42.90 kg/m    CC: CPE Subjective:    Patient ID: Eric Salazar, male    DOB: [birth year], 58 y.o.   MRN: [id number]  HPI: Eric Salazar is a 58 y.o. male presenting on 08/07/2019 for Annual Exam   [facility] bioscreen performed - some R carotid artery increased velocities as well as L leg possible PAD. He denies claudication symptoms.   Preventative: Colon cancer screening -iFOB neg 12/2015.requests repeat Prostate cancer screening - discussed. Nocturia x1, strong stream.Agrees to screen today. Lung cancer screening - 33 yr history, about 1/2 ppd. discussed, declines.  Flu - declines  Tdap 07/2011  Seat belt use discussed  Sunscreen use discussed. 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      Relevant past medical, surgical, family and social history reviewed and updated as indicated. Interim medical history since our last visit reviewed. Allergies and medications reviewed and updated. Outpatient Medications Prior to Visit  Medication Sig Dispense Refill  . aspirin EC 81 MG tablet Take 81 mg by mouth daily.    . Multiple Vitamin (MULTIVITAMIN WITH MINERALS) TABS tablet Take 1 tablet by mouth daily.    . Potassium 99 MG TABS Take 2 tablets by mouth daily.    . vitamin C (ASCORBIC ACID) 500 MG tablet Take 500 mg by mouth daily. As needed    . metoprolol tartrate (LOPRESSOR) 50 MG tablet TAKE 1  TABLET BY MOUTH  TWICE DAILY 180 tablet 0  . doxycycline (VIBRA-TABS) 100 MG tablet Take 1 tablet (100 mg total) by mouth 2 (two) times daily. 20 tablet 0   No facility-administered medications prior to visit.      Per HPI unless specifically indicated in ROS section below Review of Systems  Constitutional: Negative for activity change, appetite change, chills, fatigue, fever and unexpected weight change.  HENT: Negative for hearing loss.   Eyes: Negative for visual disturbance.  Respiratory: Negative for cough, chest tightness, shortness of breath and wheezing.   Cardiovascular: Negative for chest pain, palpitations and leg swelling.  Gastrointestinal: Negative for abdominal distention, abdominal pain, blood in stool, constipation, diarrhea, nausea and vomiting.  Genitourinary: Negative for difficulty urinating and hematuria.  Musculoskeletal: Negative for arthralgias, myalgias and neck pain.  Skin: Negative for rash.  Neurological: Negative for dizziness, seizures, syncope and headaches.  Hematological: Negative for adenopathy. Does not bruise/bleed easily.  Psychiatric/Behavioral: Negative for dysphoric mood. The patient is not nervous/anxious.    Objective:    BP 128/80 (BP Location: Left Arm, Patient Position: Sitting, Cuff Size: Large)   Pulse 69   Temp 97.8 F (36.6 C) (Temporal)   Ht 5\' 10"  (1.778 m)   Wt 299 lb (135.6 kg)   SpO2 96%   BMI 42.90 kg/m   Wt Readings from Last 3 Encounters:  08/07/19 299 lb (135.6 kg)  09/24/18 (!) 306 lb 12 oz (139.1 kg)  05/30/18 (!) 308 lb (139.7 kg)    Physical Exam Vitals signs and nursing note reviewed.  Constitutional:      General: He is not in acute distress.    Appearance: Normal appearance. He is well-developed. He is not ill-appearing.  HENT:     Head: Normocephalic and atraumatic.     Right Ear: Hearing, tympanic membrane, ear canal and external ear normal.     Left Ear: Hearing, tympanic membrane, ear canal and external  ear normal.     Nose: Nose normal.     Mouth/Throat:     Mouth: Mucous membranes are moist.     Pharynx: Uvula midline. No oropharyngeal exudate or posterior oropharyngeal erythema.  Eyes:     General: No scleral icterus.    Extraocular Movements: Extraocular movements intact.     Conjunctiva/sclera: Conjunctivae normal.     Pupils: Pupils are equal, round, and reactive to light.  Neck:     Musculoskeletal: Normal range of motion and neck supple.     Vascular: No carotid bruit.  Cardiovascular:     Rate and Rhythm: Normal rate and regular rhythm.     Pulses: Normal pulses.          Radial pulses are 2+ on the right side and 2+ on the left side.     Heart sounds: Normal heart sounds. No murmur.  Pulmonary:     Effort: Pulmonary effort is normal. No respiratory distress.     Breath sounds: Normal breath sounds. No wheezing, rhonchi or rales.  Abdominal:     General: Abdomen is flat. Bowel sounds are normal. There is no distension.     Palpations: Abdomen is soft. There is no mass.     Tenderness: There is no abdominal tenderness. There is no guarding or rebound.     Hernia: No hernia is present.  Genitourinary:    Prostate: Enlarged (25gm). Not tender and no nodules present.     Rectum: Normal. No mass, tenderness, anal fissure, external hemorrhoid or internal hemorrhoid. Normal anal tone.  Musculoskeletal: Normal range of motion.     Comments: Diminished pedal pulses bilaterally  Lymphadenopathy:     Cervical: No cervical adenopathy.  Skin:    General: Skin is warm and dry.     Findings: No rash.  Neurological:     General: No focal deficit present.     Mental Status: He is alert and oriented to person, place, and time.     Comments: CN grossly intact, station and gait intact  Psychiatric:        Mood and Affect: Mood normal.        Behavior: Behavior normal.        Thought Content: Thought content normal.        Judgment: Judgment normal.        Assessment & Plan:    Problem List Items Addressed This Visit    Tobacco use    Continues cutting down - encouraged full cessation.  Declines lung cancer screening at this time.       Prediabetes    Discussed avoiding added sugars in diet.       Relevant Orders   Hemoglobin A1c   Paroxysmal atrial fibrillation (HCC)    Denies recurrent palpitations. On aspirin, beta blocker. Encouraged cards f/u as overdue.       Relevant Medications   metoprolol tartrate (LOPRESSOR) 50 MG tablet   PAD (peripheral artery  disease) [facility])    Question of this by recent life screen - denies claudication symptoms. Will continue to monitor.       Relevant Medications   metoprolol tartrate (LOPRESSOR) 50 MG tablet   Mild hyperlipidemia    Update FLP off statin.  The 10-year ASCVD risk score Denman George DC Montez Hageman., et al., 2013) is: 14%   Values used to calculate the score:     Age: 59 years     Sex: Male     Is Non-Hispanic African American: No     Diabetic: No     Tobacco smoker: Yes     Systolic Blood Pressure: 128 mmHg     Is BP treated: Yes     HDL Cholesterol: 45.7 mg/dL     Total Cholesterol: 193 mg/dL       Relevant Medications   metoprolol tartrate (LOPRESSOR) 50 MG tablet   Healthcare maintenance - Primary    Preventative protocols reviewed and updated unless pt declined. Discussed healthy diet and lifestyle.       Essential hypertension    Chronic, stable on metoprolol - continue.       Relevant Medications   metoprolol tartrate (LOPRESSOR) 50 MG tablet   Other Relevant Orders   Lipid panel   Comprehensive metabolic panel   Body mass index (BMI) 40.0-44.9, adult (HCC)    Encouraged healthy diet and lifestyle changes to affect sustainable weight loss.        Other Visit Diagnoses    Special screening for malignant neoplasms, colon       Relevant Orders   Fecal occult blood, imunochemical   Special screening for malignant neoplasm of prostate       Relevant Orders   PSA       Meds ordered this  encounter  Medications  . metoprolol tartrate (LOPRESSOR) 50 MG tablet    Sig: Take 1 tablet (50 mg total) by mouth 2 (two) times daily.    Dispense:  180 tablet    Refill:  3   Orders Placed This Encounter  Procedures  . Fecal occult blood, imunochemical    Standing Status:   Future    Standing Expiration Date:   08/06/2020  . Lipid panel  . Comprehensive metabolic panel  . Hemoglobin A1c  . PSA    Follow up plan: No follow-ups on file.  Eustaquio Boyden, MD

## 2019-08-07 NOTE — Assessment & Plan Note (Signed)
Encouraged healthy diet and lifestyle changes to affect sustainable weight loss.  

## 2019-08-07 NOTE — Patient Instructions (Addendum)
Pass by lab for stool kit.  Labs today You are doing well today Continue regular walking routine.  Return as needed or in 1 year for next physical.  Health Maintenance, Male Adopting a healthy lifestyle and getting preventive care are important in promoting health and wellness. Ask your health care provider about:  The right schedule for you to have regular tests and exams.  Things you can do on your own to prevent diseases and keep yourself healthy. What should I know about diet, weight, and exercise? Eat a healthy diet   Eat a diet that includes plenty of vegetables, fruits, low-fat dairy products, and lean protein.  Do not eat a lot of foods that are high in solid fats, added sugars, or sodium. Maintain a healthy weight Body mass index (BMI) is a measurement that can be used to identify possible weight problems. It estimates body fat based on height and weight. Your health care provider can help determine your BMI and help you achieve or maintain a healthy weight. Get regular exercise Get regular exercise. This is one of the most important things you can do for your health. Most adults should:  Exercise for at least 150 minutes each week. The exercise should increase your heart rate and make you sweat (moderate-intensity exercise).  Do strengthening exercises at least twice a week. This is in addition to the moderate-intensity exercise.  Spend less time sitting. Even light physical activity can be beneficial. Watch cholesterol and blood lipids Have your blood tested for lipids and cholesterol at 58 years of age, then have this test every 5 years. You may need to have your cholesterol levels checked more often if:  Your lipid or cholesterol levels are high.  You are older than 58 years of age.  You are at high risk for heart disease. What should I know about cancer screening? Many types of cancers can be detected early and may often be prevented. Depending on your health  history and family history, you may need to have cancer screening at various ages. This may include screening for:  Colorectal cancer.  Prostate cancer.  Skin cancer.  Lung cancer. What should I know about heart disease, diabetes, and high blood pressure? Blood pressure and heart disease  High blood pressure causes heart disease and increases the risk of stroke. This is more likely to develop in people who have high blood pressure readings, are of African descent, or are overweight.  Talk with your health care provider about your target blood pressure readings.  Have your blood pressure checked: ? Every 3-5 years if you are 47-58 years of age. ? Every year if you are 17 years old or older.  If you are between the ages of 58 and 30 and are a current or former smoker, ask your health care provider if you should have a one-time screening for abdominal aortic aneurysm (AAA). Diabetes Have regular diabetes screenings. This checks your fasting blood sugar level. Have the screening done:  Once every three years after age 39 if you are at a normal weight and have a low risk for diabetes.  More often and at a younger age if you are overweight or have a high risk for diabetes. What should I know about preventing infection? Hepatitis B If you have a higher risk for hepatitis B, you should be screened for this virus. Talk with your health care provider to find out if you are at risk for hepatitis B infection. Hepatitis C Blood testing  is recommended for:  Everyone born from [birth year] through 1965.  Anyone with known risk factors for hepatitis C. Sexually transmitted infections (STIs)  You should be screened each year for STIs, including gonorrhea and chlamydia, if: ? You are sexually active and are younger than 58 years of age. ? You are older than 58 years of age and your health care provider tells you that you are at risk for this type of infection. ? Your sexual activity has changed since  you were last screened, and you are at increased risk for chlamydia or gonorrhea. Ask your health care provider if you are at risk.  Ask your health care provider about whether you are at high risk for HIV. Your health care provider may recommend a prescription medicine to help prevent HIV infection. If you choose to take medicine to prevent HIV, you should first get tested for HIV. You should then be tested every 3 months for as long as you are taking the medicine. Follow these instructions at home: Lifestyle  Do not use any products that contain nicotine or tobacco, such as cigarettes, e-cigarettes, and chewing tobacco. If you need help quitting, ask your health care provider.  Do not use street drugs.  Do not share needles.  Ask your health care provider for help if you need support or information about quitting drugs. Alcohol use  Do not drink alcohol if your health care provider tells you not to drink.  If you drink alcohol: ? Limit how much you have to 0-2 drinks a day. ? Be aware of how much alcohol is in your drink. In the [place] one drink equals one 12 oz bottle of beer (355 mL), one 5 oz glass of wine (148 mL), or one 1 oz glass of hard liquor (44 mL). General instructions  Schedule regular health, dental, and eye exams.  Stay current with your vaccines.  Tell your health care provider if: ? You often feel depressed. ? You have ever been abused or do not feel safe at home. Summary  Adopting a healthy lifestyle and getting preventive care are important in promoting health and wellness.  Follow your health care provider's instructions about healthy diet, exercising, and getting tested or screened for diseases.  Follow your health care provider's instructions on monitoring your cholesterol and blood pressure. This information is not intended to replace advice given to you by your health care provider. Make sure you discuss any questions you have with your health care  provider. Document Released: 05/19/2008 Document Revised: 11/14/2018 Document Reviewed: 11/14/2018 Elsevier Patient Education  2020 [employer].

## 2019-08-07 NOTE — Assessment & Plan Note (Signed)
Preventative protocols reviewed and updated unless pt declined. Discussed healthy diet and lifestyle.  

## 2019-08-07 NOTE — Assessment & Plan Note (Signed)
Chronic, stable on metoprolol - continue  

## 2019-08-07 NOTE — Assessment & Plan Note (Signed)
Discussed avoiding added sugars in diet. 

## 2019-08-07 NOTE — Assessment & Plan Note (Signed)
Continues cutting down - encouraged full cessation.  Declines lung cancer screening at this time.

## 2019-08-08 LAB — HEMOGLOBIN A1C: Hgb A1c MFr Bld: 5.8 % (ref 4.6–6.5)

## 2019-08-08 LAB — COMPREHENSIVE METABOLIC PANEL
ALT: 38 U/L (ref 0–53)
AST: 32 U/L (ref 0–37)
Albumin: 4.1 g/dL (ref 3.5–5.2)
Alkaline Phosphatase: 84 U/L (ref 39–117)
BUN: 20 mg/dL (ref 6–23)
CO2: 32 mEq/L (ref 19–32)
Calcium: 9.6 mg/dL (ref 8.4–10.5)
Chloride: 101 mEq/L (ref 96–112)
Creatinine, Ser: 1.14 mg/dL (ref 0.40–1.50)
GFR: 66.02 mL/min (ref >60.00)
Glucose, Bld: 92 mg/dL (ref 70–99)
Potassium: 5 mEq/L (ref 3.5–5.1)
Sodium: 139 mEq/L (ref 135–145)
Total Bilirubin: 1.1 mg/dL (ref 0.2–1.2)
Total Protein: 7.1 g/dL (ref 6.0–8.3)

## 2019-08-08 LAB — LIPID PANEL
Cholesterol: 194 mg/dL (ref 0–200)
HDL: 52.1 mg/dL (ref >39.00)
LDL Cholesterol: 125 mg/dL — ABNORMAL HIGH (ref 0–99)
NonHDL: 142.31
Total CHOL/HDL Ratio: 4
Triglycerides: 87 mg/dL (ref 0.0–149.0)
VLDL: 17.4 mg/dL (ref 0.0–40.0)

## 2019-08-08 LAB — PSA: PSA: 3.08 ng/mL (ref 0.10–4.00)

## 2019-11-08 ENCOUNTER — Ambulatory Visit (INDEPENDENT_AMBULATORY_CARE_PROVIDER_SITE_OTHER): Payer: BC Managed Care – PPO | Admitting: Nurse Practitioner

## 2019-11-08 ENCOUNTER — Other Ambulatory Visit: Payer: Self-pay

## 2019-11-08 ENCOUNTER — Encounter: Payer: Self-pay | Admitting: Nurse Practitioner

## 2019-11-08 VITALS — BP 118/50 | HR 59 | Ht 70.0 in | Wt 312.2 lb

## 2019-11-08 DIAGNOSIS — I480 Paroxysmal atrial fibrillation: Secondary | ICD-10-CM

## 2019-11-08 DIAGNOSIS — Z720 Tobacco use: Secondary | ICD-10-CM

## 2019-11-08 DIAGNOSIS — Z789 Other specified health status: Secondary | ICD-10-CM

## 2019-11-08 DIAGNOSIS — Z7289 Other problems related to lifestyle: Secondary | ICD-10-CM

## 2019-11-08 NOTE — Progress Notes (Signed)
Office Visit    Patient Name: Eric Salazar Date of Encounter: 11/08/2019  Primary Care Provider:  Eustaquio BoydenGutierrez, Javier, MD Primary Cardiologist:  Julien Nordmannimothy Gollan, MD  Chief Complaint    58 year old male with a history of paroxysmal atrial fibrillation, obesity, chest pain, borderline diabetes, and remote etoh and tobacco abuse, who presents for follow-up related to PAF.  Past Medical History    Past Medical History:  Diagnosis Date  . Agatston coronary artery calcium score less than 100    a. 06/2017 Cardiac CT: Cor Ca2+ = 11-- 54th%'ile.  . Cellulitis and abscess of left leg 09/24/2018  . History of tobacco abuse    a.Quit 2016.  . Morbid obesity (HCC)   . Paroxysmal atrial fibrillation (HCC)    a. 12/2015 Echo: EF 60-65%, no rwma; b. Converted on oral BB; c. CHA2DS2VASc = 0-->on ASA 81.  . Precordial chest pain    a. 01/2011 Dobutamine Echo: No rwma->nl; b. 12/2015 Recurrent c/p in setting of AF RVR-->MV: mid ant/apical ant fixed defect, likely artifact. No ischemia. EF 55-65%.  . Prediabetes    Past Surgical History:  Procedure Laterality Date  . CARDIOVASCULAR STRESS TEST  12/2015   low risk study, EF 55-65%  . DOBUTAMINE STRESS ECHO  01/2011   for typical/atypical chest pain - good tolerance, 11 METS, no significant abnormalities  . MRI lumbar  2008   disc protrusion L4/5 with impingement L5 and probably L4 nerve roots   Allergies  No Known Allergies  History of Present Illness    58 year old male with the above past medical history including paroxysmal atrial fibrillation, obesity, chest pain, borderline diabetes, remote etoh and tobacco abuse.  He was previous evaluated with stress testing in 2012 in the setting of atypical chest pain, and this was normal.  In January 2017, he was [facility] with chest pain and dyspnea was found to be in atrial fibrillation with rapid ventricular response.  He was easily rate controlled on beta-blocker therapy.   Echocardiogram showed normal LV function and stress testing was nonischemic.  He subsequently converted to sinus rhythm on oral beta-blocker.  CHA2DS2VASc was 0 and therefore, he was placed on aspirin only.  He was last seen in clinic in April 2018, at which time he reported atypical chest pain.  Cardiac CT for coronary calcium was performed and showed a coronary calcium score of 11, placing him in the 54th percentile for age/sex.  Recommendation was made for lifestyle modifications.  Since his last visit, he has felt well.  He walks about 30 minutes most days of the week prior to work.  He continues to drive a truck.  He occasionally notes a brief episode of fluttering in his chest, occurring a few times a month, lasting about a minute, and resolving spontaneously.  He has not had any prolonged episodes.  He denies chest pain or dyspnea.  His blood pressure at home is frequently in the 130s.  He continues to smoke a cigarette from time to time.  He has 3-4 alcoholic beverages per week.  He denies PND, orthopnea, dizziness, syncope, edema, or early satiety.  Home Medications    Prior to Admission medications   Medication Sig Start Date End Date Taking? Authorizing Provider  aspirin EC 81 MG tablet Take 81 mg by mouth daily.    [provider]  metoprolol tartrate (LOPRESSOR) 50 MG tablet Take 1 tablet (50 mg total) by mouth 2 (two) times daily. 08/07/19  Eustaquio Boyden, MD  Multiple Vitamin (MULTIVITAMIN WITH MINERALS) TABS tablet Take 1 tablet by mouth daily.    [provider]  Potassium 99 MG TABS Take 2 tablets by mouth daily.    [provider]  vitamin C (ASCORBIC ACID) 500 MG tablet Take 500 mg by mouth daily. As needed    [provider]    Review of Systems    Occasional palpitations.  He denies chest pain, dyspnea, PND, orthopnea, dizziness, syncope, edema, or early satiety.  All other systems reviewed and are otherwise negative except as noted above.   Physical Exam    VS:  BP (!) 118/50 (BP Location: Left Arm, Patient Position: Sitting, Cuff Size: Large)   Pulse (!) 59   Ht 5\' 10"  (1.778 m)   Wt (!) 312 lb 4 oz (141.6 kg)   BMI 44.80 kg/m  , BMI Body mass index is 44.8 kg/m. GEN: Obese, in no acute distress. HEENT: normal. Neck: Supple, obese, difficult to gauge JVP.  No carotid bruits, or masses. Cardiac: RRR, no murmurs, rubs, or gallops. No clubbing, cyanosis, trace bilateral ankle edema.  Radials/PT 2+ and equal bilaterally.  Respiratory:  Respirations regular and unlabored, clear to auscultation bilaterally. GI: Obese, soft, nontender, nondistended, BS + x 4. MS: no deformity or atrophy. Skin: warm and dry, no rash. Neuro:  Strength and sensation are intact. Psych: Normal affect.  Accessory Clinical Findings    ECG personally reviewed by me today -sinus bradycardia, 59 - no acute changes.  Lab Results  Component Value Date   WBC 6.7 12/24/2015   HGB 14.9 12/24/2015   HCT 44.3 12/24/2015   MCV 91.9 12/24/2015   PLT 188 12/24/2015   Lab Results  Component Value Date   CREATININE 1.14 08/07/2019   BUN 20 08/07/2019   NA 139 08/07/2019   K 5.0 08/07/2019   CL 101 08/07/2019   CO2 32 08/07/2019   Lab Results  Component Value Date   ALT 38 08/07/2019   AST 32 08/07/2019   ALKPHOS 84 08/07/2019   BILITOT 1.1 08/07/2019   Lab Results  Component Value Date   CHOL 194 08/07/2019   HDL 52.10 08/07/2019   LDLCALC 125 (H) 08/07/2019   LDLDIRECT 106 08/08/2003   TRIG 87.0 08/07/2019   CHOLHDL 4 08/07/2019    Lab Results  Component Value Date   HGBA1C 5.8 08/07/2019    Assessment & Plan    1.  Paroxysmal atrial fibrillation: This was diagnosed in early 2017 and he has been in sinus rhythm on beta-blocker therapy since.  CHA2DS2VASc equals 0 however, he does carry a history of borderline hypertension and mild elevation of A1c at 5.8.  He remains on low-dose aspirin therapy.  He continues to smoke an  occasional cigarette and imbibes 3-4 alcoholic beverages per week.  Though he has not had any prolonged episodes of palpitations, he has had occasional fluttering in his chest, maybe a few times per month, lasting about a minute, and resolving spontaneously.  I encouraged lifestyle modifications including complete cessation of tobacco and alcohol as well as increased activity, caloric restriction, and weight loss.  I also advised that if he has any prolonged palpitations, we would have a low threshold to place an event monitor.  Continue beta-blocker therapy.  2.  Borderline hypertension: Blood pressure initially 138/82.  After talking with him for a period of time, I repeated an got 118/50.  As above, we discussed the importance of lifestyle modifications, weight  loss, and sodium restriction.  He does eat out or bringing fast food frequently.  3.  Morbid obesity: See above.  4.  Tobacco and alcohol use: Complete cessation advised.  5.  Coronary calcium with history of chest pain: No recurrent chest pain over the past 2 years.  Coronary calcium was 11 in July 2018.  He remains on low-dose aspirin therapy.  As above, discussed the importance of lifestyle modifications.  Would have a low threshold to add statin in the future.  6.  Disposition: Follow-up in 1 year.   Murray Hodgkins, NP 11/08/2019, 8:54 AM

## 2019-11-08 NOTE — Patient Instructions (Signed)
Medication Instructions:  No changes  *If you need a refill on your cardiac medications before your next appointment, please call your pharmacy*   Lab Work: None  If you have labs (blood work) drawn today and your tests are completely normal, you will receive your results only by: . MyChart Message (if you have MyChart) OR . A paper copy in the mail If you have any lab test that is abnormal or we need to change your treatment, we will call you to review the results.   Testing/Procedures: None   Follow-Up: At [facility], you and your health needs are our priority.  As part of our continuing mission to provide you with exceptional heart care, we have created designated Provider Care Teams.  These Care Teams include your primary Cardiologist (physician) and Advanced Practice Providers (APPs -  Physician Assistants and Nurse Practitioners) who all work together to provide you with the care you need, when you need it.   Your next appointment:   1 year(s)  The format for your next appointment:   In Person  Provider:    You may see Timothy Gollan, MD or one of the following Advanced Practice Providers on your designated Care Team:    Christopher Berge, NP  Ryan Dunn, PA-C  Jacquelyn Visser, PA-C   

## 2020-01-21 ENCOUNTER — Telehealth: Payer: Self-pay | Admitting: Cardiovascular Disease

## 2020-01-21 DIAGNOSIS — R079 Chest pain, unspecified: Secondary | ICD-10-CM

## 2020-01-21 NOTE — Telephone Encounter (Signed)
Left voicemail message that I need to speak with him about DOT requirements and possible need for testing.

## 2020-01-21 NOTE — Telephone Encounter (Signed)
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

## 2020-01-22 NOTE — Telephone Encounter (Signed)
Patient returned call and I reviewed that based on DOT requirements he may need some additional testing but it would be up to the provider to review if we can do a letter. Let him know that I would send message and would be in touch if we need any additional testing or information. He verbalized understanding with no further questions at this time.

## 2020-01-22 NOTE — Telephone Encounter (Signed)
Left voicemail message to call back  

## 2020-01-22 NOTE — Telephone Encounter (Signed)
Spoke with patient and reviewed provider recommendations and requirements for DOT clearance. Reviewed that he would need 2 day lexiscan and assisted with scheduling. He started to yell stating "why do you make it so difficult" and verified both days and times. He then wanted to know if we would provide documentation that day after he finishes the test. I informed him that it would then require 24-48 hours for provider to review results and then it will be sent to provider for review and completion of clearance letter. Then I was starting to tell him instructions and he hung up abruptly. Called him right back to tell him there were some instructions which we need to review for him to have his stress test. He was loud stating that he wants me to send it through My Chart and then hung up again. Will submit to precert and send him instructions.

## 2020-01-22 NOTE — Telephone Encounter (Signed)
With history of chest pain, DOT requires stress testing q 2 yrs.  His last was in 2017.  He will need a 2 day lexiscan myoview.

## 2020-01-24 ENCOUNTER — Telehealth: Payer: Self-pay | Admitting: Cardiovascular Disease

## 2020-01-24 NOTE — Telephone Encounter (Signed)
Patient calling back to discuss insurance.    Patient states he spoke with [employer] and they do by mistake have his insurance as secondary which they are working on correcting.   Patient instructed to call back to reschedule when he sorts out insurance issues and is ready to do testing.   Patient comments " yeah whatever " and disconnects call.

## 2020-01-24 NOTE — Telephone Encounter (Signed)
Patient returned call to discuss insurance.  He states he only has [employer].   Per Marchelle Folks in precert testing to be cancelled and patient should call rep # on insurance card.   Patient notified but disconnected call.   Called patient back to clarify since call was disconnected that he is aware testing is cancelled .    Patient voiced understanding and dropped call again.    Patient called back  And was directed to please not disconnect the call until he answered if he would like testing rescheduled on call back or if he wanted the md notified that he is cancelling.    aqPatient also made aware that staff can be more helpful if calls are not disconnected.

## 2020-01-24 NOTE — Telephone Encounter (Signed)
Patient calling back to discuss something he just thought of.   He wanted to know why there was no issue with being seen in the office in December but now there is an issue.    Explained to patient there is not precert for office visit but for testing the precert team submits a request and at that time the insurance company decides if they will approve based on clinicals and insurance plan.    Patient states this does not make since.   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  Patient also states he understands that we would take the opportunity to make a quick buck thus ordering the stress test before supplying a letter.   Explained to patient this is not always a requirement of our office but instead required with letter to submit documented cardiac function %   Patient aware that this test was at this time denied by insurance and they make the decision to approve or deny.

## 2020-01-24 NOTE — Telephone Encounter (Signed)
Per Marchelle Folks in precert called patient to reschedule nuc stress test.   [employer] will not authorize without going through patient primary (unknown) insurance.   Attempted to get primary insurance info from patient .  Patient states he will be home in an hour and can call back but not to worry " you will get your damn money"   Call disconnected and testing cancelled.

## 2020-01-27 NOTE — Telephone Encounter (Signed)
Wife is calling in wanting to learn more about the pre-auth and [employer] coverage  Please call [place] at [phone] able

## 2020-01-27 NOTE — Telephone Encounter (Signed)
Patient calling to discuss insurance.  He states [employer] is working on the error today and would like to call back asap when fixed to reschedule nuc.   Patient wants to know if he can pay out of pocket and or what would it cost to not file an insurance claim.

## 2020-02-04 ENCOUNTER — Other Ambulatory Visit: Payer: Self-pay

## 2020-02-04 ENCOUNTER — Encounter
Admission: RE | Admit: 2020-02-04 | Discharge: 2020-02-04 | Disposition: A | Discharge status: Home / Self Care | Payer: BC Managed Care – PPO | Source: Ambulatory Visit | Attending: Nurse Practitioner | Admitting: Nurse Practitioner

## 2020-02-04 DIAGNOSIS — R079 Chest pain, unspecified: Secondary | ICD-10-CM

## 2020-02-04 MED ORDER — TECHNETIUM TC 99M TETROFOSMIN IV KIT: 30.0000 | PACK | Freq: Once | INTRAVENOUS | Status: DC | PRN

## 2020-02-05 ENCOUNTER — Encounter
Admission: RE | Admit: 2020-02-05 | Discharge: 2020-02-05 | Disposition: A | Discharge status: Home / Self Care | Payer: BC Managed Care – PPO | Source: Ambulatory Visit | Attending: Nurse Practitioner | Admitting: Nurse Practitioner

## 2020-02-05 DIAGNOSIS — R079 Chest pain, unspecified: Secondary | ICD-10-CM | POA: Insufficient documentation

## 2020-02-05 MED ORDER — TECHNETIUM TC 99M TETROFOSMIN IV KIT
30.0000 | PACK | Freq: Once | INTRAVENOUS | Status: AC | PRN
  Administered 2020-02-05: 31.839 via INTRAVENOUS

## 2020-02-06 ENCOUNTER — Telehealth: Payer: Self-pay | Admitting: Nurse Practitioner

## 2020-02-06 LAB — NM MYOCAR MULTI W/SPECT W/WALL MOTION / EF
Estimated workload: 1 METS
Exercise duration (min): 0 min
Exercise duration (sec): 0 s
LV dias vol: 139 mL (ref 62–150)
LV sys vol: 65 mL
MPHR: 162 {beats}/min
Peak HR: 91 {beats}/min
Percent HR: 56 %
Rest HR: 76 {beats}/min
SDS: 0
SRS: 7
SSS: 0
TID: 1.02

## 2020-02-06 NOTE — Telephone Encounter (Signed)
Call to patient to make him aware of test results.   No further questions at this time.   Will include result in mychart message for his job.   Advised pt to call for any further questions or concerns.

## 2020-02-06 NOTE — Telephone Encounter (Signed)
° ° °  Please call with stress test results °

## 2020-02-06 NOTE — Telephone Encounter (Signed)
-----   Message from Creig Hines, NP sent at 02/06/2020 12:33 PM EST ----- Stress test is normal w/o any suggestion of reduced blood flow to heart muscle.    Nl heart squeezing function.  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

## 2020-05-08 ENCOUNTER — Other Ambulatory Visit: Payer: Self-pay | Admitting: Family Medicine

## 2020-05-24 ENCOUNTER — Other Ambulatory Visit: Payer: Self-pay | Admitting: Family Medicine

## 2020-07-25 ENCOUNTER — Other Ambulatory Visit: Payer: Self-pay | Admitting: Family Medicine

## 2020-07-27 NOTE — Telephone Encounter (Signed)
E-scribed refill.  Plz schedule cpe and lab visits.  

## 2020-07-29 NOTE — Telephone Encounter (Signed)
Called patient and left voicemail for patient to call office and schedule CPE and labs. 

## 2020-10-18 ENCOUNTER — Other Ambulatory Visit: Payer: Self-pay | Admitting: Family Medicine

## 2020-10-19 NOTE — Telephone Encounter (Signed)
E-scribed refill.  Plz schedule CPE and lab visits. 

## 2020-10-28 ENCOUNTER — Other Ambulatory Visit: Payer: Self-pay

## 2020-10-28 ENCOUNTER — Other Ambulatory Visit: Payer: BC Managed Care – PPO

## 2020-10-28 DIAGNOSIS — Z20822 Contact with and (suspected) exposure to covid-19: Secondary | ICD-10-CM

## 2020-10-30 LAB — NOVEL CORONAVIRUS, NAA: SARS-CoV-2, NAA: NOT DETECTED

## 2020-10-30 LAB — SARS-COV-2, NAA 2 DAY TAT

## 2020-11-06 ENCOUNTER — Other Ambulatory Visit: Payer: BC Managed Care – PPO

## 2020-11-06 DIAGNOSIS — Z20822 Contact with and (suspected) exposure to covid-19: Secondary | ICD-10-CM | POA: Diagnosis not present

## 2020-11-07 LAB — NOVEL CORONAVIRUS, NAA: SARS-CoV-2, NAA: NOT DETECTED

## 2020-11-07 LAB — SARS-COV-2, NAA 2 DAY TAT

## 2020-11-25 ENCOUNTER — Encounter: Payer: Self-pay | Admitting: Family Medicine

## 2020-11-25 NOTE — Telephone Encounter (Signed)
Called twice sending letter EM

## 2020-11-25 NOTE — Telephone Encounter (Signed)
Called and left vm for the patient to call and schedule cpe and labs. EM 11/25/20

## 2021-01-24 ENCOUNTER — Telehealth: Payer: Self-pay | Admitting: Family Medicine

## 2021-01-25 NOTE — Telephone Encounter (Signed)
Pharmacy requests refill on: Metoprolol Tartrate 50 mg   LAST REFILL: 10/19/2020 (Q-180, R-0)  LAST OV: 08/07/2019 NEXT OV: Not Scheduled  PHARMACY: [facility]

## 2021-01-25 NOTE — Telephone Encounter (Signed)
Called and left a message for the patient to call and schedule cpe and labs. EM

## 2021-01-25 NOTE — Telephone Encounter (Signed)
Noted  

## 2021-01-26 NOTE — Telephone Encounter (Signed)
Called and left 2nd message. Will mail letter to patient to schedule. EM

## 2021-02-28 ENCOUNTER — Other Ambulatory Visit: Payer: Self-pay | Admitting: Family Medicine

## 2021-02-28 DIAGNOSIS — Z125 Encounter for screening for malignant neoplasm of prostate: Secondary | ICD-10-CM

## 2021-02-28 DIAGNOSIS — I10 Essential (primary) hypertension: Secondary | ICD-10-CM

## 2021-02-28 DIAGNOSIS — R7303 Prediabetes: Secondary | ICD-10-CM

## 2021-02-28 DIAGNOSIS — E785 Hyperlipidemia, unspecified: Secondary | ICD-10-CM

## 2021-03-01 NOTE — Progress Notes (Signed)
Cardiology Office Note  Date:  03/02/2021   ID:  TRAYSON STITELY, DOB 23-Mar-1961, MRN [id number]  PCP:  Eustaquio Boyden, MD   Chief Complaint  Patient presents with  . Follow-up    Annual follow up. Medications verbally reviewed with patient.     HPI:  60 year old male with  hypertension ,  Prior smoking history,  previous heavy alcohol use per the notes   admitted January 2017 with chest pain and shortness of breath, found to be in atrial fibrillation with RVR, initial rate control with beta blockers, converting to normal sinus rhythm.  not placed on anticoagulation.  Mild OSA, does not want CPAP He presents today for follow-up of his paroxysmal atrial fibrillation , prior sx of  chest pain  LOV with myself 03/2017 Seen by one of our providers 11/2020  Prior imaging reviewed Cardiac CT for coronary calcium score showed a coronary calcium score of 11, in July 2018  continues to drive a truck No regular exercise program  3-4 alcoholic beverages per week.   smoke an occasional cigarette Weight continues to run high Tries to do some walking and stretching in the morning before his workday  Has 25 year old son finishing first year in college  Lab work performed earlier today reviewed with him Total chol 190, LDL 126 CR 1.03 A1C 5.7 PSA 2.4  Blood pressure little high today, was rushing in Does not check blood pressure at home  EKG personally reviewed by myself on todays visit Shows normal sinus rhythm rate 65 bpm no significant ST-T wave changes  Past medical history reviewed On prior office visit reported having some headaches,  "balloon popping" In his head Had symptoms for many years No clear association with anything  Prior chest pain, none recently  Family history discussed with him on today's visit Mother with MI Dad with CVA  He did have outpatient sleep study,  showing 8 to 9  AHI, not  interested in management of his mild sleep apnea,  does not get  daytime somnolence Works different shifts, sleep is always broken  Echocardiogram showed normal LV function.  Stress testing was nonischemic.  PMH:   has a past medical history of Agatston coronary artery calcium score less than 100, Cellulitis and abscess of left leg (09/24/2018), History of tobacco abuse, Morbid obesity (HCC), Paroxysmal atrial fibrillation (HCC), Precordial chest pain, and Prediabetes.  PSH:    Past Surgical History:  Procedure Laterality Date  . CARDIOVASCULAR STRESS TEST  12/2015   low risk study, EF 55-65%  . DOBUTAMINE STRESS ECHO  01/2011   for typical/atypical chest pain - good tolerance, 11 METS, no significant abnormalities  . MRI lumbar  2008   disc protrusion L4/5 with impingement L5 and probably L4 nerve roots    Current Outpatient Medications  Medication Sig Dispense Refill  . aspirin EC 81 MG tablet Take 81 mg by mouth daily.    . metoprolol tartrate (LOPRESSOR) 50 MG tablet Take 1 tablet (50 mg total) by mouth 2 (two) times daily. 180 tablet 0  . Multiple Vitamin (MULTIVITAMIN WITH MINERALS) TABS tablet Take 1 tablet by mouth daily.    . Potassium 99 MG TABS Take 2 tablets by mouth daily.    . vitamin C (ASCORBIC ACID) 500 MG tablet Take 500 mg by mouth daily. As needed     No current facility-administered medications for this visit.    Allergies:   Patient has no known allergies.   Social History:  The patient  reports that he has been smoking cigarettes. He has never used smokeless tobacco. He reports current alcohol use of about 3.0 standard drinks of alcohol per week. He reports that he does not use drugs.   Family History:   family history includes Coronary artery disease (age of onset: 20) in his mother; Diabetes in his mother; Healthy in his sister; Heart attack in his mother; Hypertension in his father and mother.    Review of Systems: Review of Systems  Constitutional: Negative.   Respiratory: Negative.   Cardiovascular: Positive for  chest pain.  Gastrointestinal: Negative.   Musculoskeletal: Negative.   Neurological: Positive for headaches.  Psychiatric/Behavioral: Negative.   All other systems reviewed and are negative.    PHYSICAL EXAM: VS:  BP 140/90 (BP Location: Left Arm, Patient Position: Sitting, Cuff Size: Large)   Pulse 65   Ht 5\' 10"  (1.778 m)   Wt (!) 308 lb (139.7 kg)   SpO2 98%   BMI 44.19 kg/m  , BMI Body mass index is 44.19 kg/m. Constitutional:  oriented to person, place, and time. No distress.  HENT:  Head: Grossly normal Eyes:  no discharge. No scleral icterus.  Neck: No JVD, no carotid bruits  Cardiovascular: Regular rate and rhythm, no murmurs appreciated Pulmonary/Chest: Clear to auscultation bilaterally, no wheezes or rails Abdominal: Soft.  no distension.  no tenderness.  Musculoskeletal: Normal range of motion Neurological:  normal muscle tone. Coordination normal. No atrophy Skin: Skin warm and dry Psychiatric: normal affect, pleasant  Recent Labs: 03/02/2021: ALT 32; BUN 21; Creatinine, Ser 1.04; Potassium 4.5; Sodium 139    Lipid Panel Lab Results  Component Value Date   CHOL 190 03/02/2021   HDL 45.30 03/02/2021   LDLCALC 126 (H) 03/02/2021   TRIG 92.0 03/02/2021      Wt Readings from Last 3 Encounters:  03/02/21 (!) 308 lb (139.7 kg)  11/08/19 (!) 312 lb 4 oz (141.6 kg)  08/07/19 299 lb (135.6 kg)      ASSESSMENT AND PLAN:  Essential hypertension Blood pressure running high, Weight has trended higher over the past 2 years Recommended calorie restriction, low sodium, monitor blood pressure at home If it continues to run high I recommend he call our office, we would need to optimize his blood pressure, especially prior to [facility] January 2023  Paroxysmal atrial fibrillation (HCC) Denies sx,  Denies any tachypalpitations  Ex-smoker  smoking several cigarettes per day, 2-3 Smoking cessation recommended  Body mass index (BMI) 40.0-44.9, adult (HCC) Weight  has trended upwards over the past 2 years, will recommended low carbohydrate diet  SOB (shortness of breath) Weight higher No regular exercise program. Recommended weight loss Recommend regular walking program  Mild sleep apnea We have encouraged continued exercise, careful diet management in an effort to lose weight.  Nonintractable headache, unspecified chronicity pattern, unspecified headache type Not discussed on today's visit  Chest pain, unspecified type No recent sx, Low calcium score   Total encounter time more than 25 minutes  Greater than 50% was spent in counseling and coordination of care with the patient   No orders of the defined types were placed in this encounter.    Signed, 09-17-1982, M.D., Ph.D. 03/02/2021  [facility] [place], [place]

## 2021-03-02 ENCOUNTER — Other Ambulatory Visit: Payer: Self-pay

## 2021-03-02 ENCOUNTER — Encounter: Payer: Self-pay | Admitting: Cardiovascular Disease

## 2021-03-02 ENCOUNTER — Other Ambulatory Visit (INDEPENDENT_AMBULATORY_CARE_PROVIDER_SITE_OTHER): Payer: BC Managed Care – PPO

## 2021-03-02 ENCOUNTER — Ambulatory Visit (INDEPENDENT_AMBULATORY_CARE_PROVIDER_SITE_OTHER): Payer: BC Managed Care – PPO | Admitting: Cardiovascular Disease

## 2021-03-02 VITALS — BP 140/90 | HR 65 | Ht 70.0 in | Wt 308.0 lb

## 2021-03-02 DIAGNOSIS — R7303 Prediabetes: Secondary | ICD-10-CM

## 2021-03-02 DIAGNOSIS — I10 Essential (primary) hypertension: Secondary | ICD-10-CM

## 2021-03-02 DIAGNOSIS — Z789 Other specified health status: Secondary | ICD-10-CM

## 2021-03-02 DIAGNOSIS — R079 Chest pain, unspecified: Secondary | ICD-10-CM | POA: Diagnosis not present

## 2021-03-02 DIAGNOSIS — Z125 Encounter for screening for malignant neoplasm of prostate: Secondary | ICD-10-CM

## 2021-03-02 DIAGNOSIS — I480 Paroxysmal atrial fibrillation: Secondary | ICD-10-CM

## 2021-03-02 DIAGNOSIS — E785 Hyperlipidemia, unspecified: Secondary | ICD-10-CM | POA: Diagnosis not present

## 2021-03-02 DIAGNOSIS — I739 Peripheral vascular disease, unspecified: Secondary | ICD-10-CM

## 2021-03-02 DIAGNOSIS — Z720 Tobacco use: Secondary | ICD-10-CM

## 2021-03-02 DIAGNOSIS — Z7289 Other problems related to lifestyle: Secondary | ICD-10-CM

## 2021-03-02 LAB — COMPREHENSIVE METABOLIC PANEL
ALT: 32 U/L (ref 0–53)
AST: 25 U/L (ref 0–37)
Albumin: 3.9 g/dL (ref 3.5–5.2)
Alkaline Phosphatase: 85 U/L (ref 39–117)
BUN: 21 mg/dL (ref 6–23)
CO2: 33 mEq/L — ABNORMAL HIGH (ref 19–32)
Calcium: 9.3 mg/dL (ref 8.4–10.5)
Chloride: 102 mEq/L (ref 96–112)
Creatinine, Ser: 1.04 mg/dL (ref 0.40–1.50)
GFR: 78.67 mL/min (ref >60.00)
Glucose, Bld: 113 mg/dL — ABNORMAL HIGH (ref 70–99)
Potassium: 4.5 mEq/L (ref 3.5–5.1)
Sodium: 139 mEq/L (ref 135–145)
Total Bilirubin: 1 mg/dL (ref 0.2–1.2)
Total Protein: 6.7 g/dL (ref 6.0–8.3)

## 2021-03-02 LAB — LIPID PANEL
Cholesterol: 190 mg/dL (ref 0–200)
HDL: 45.3 mg/dL (ref >39.00)
LDL Cholesterol: 126 mg/dL — ABNORMAL HIGH (ref 0–99)
NonHDL: 144.89
Total CHOL/HDL Ratio: 4
Triglycerides: 92 mg/dL (ref 0.0–149.0)
VLDL: 18.4 mg/dL (ref 0.0–40.0)

## 2021-03-02 LAB — PSA: PSA: 2.42 ng/mL (ref 0.10–4.00)

## 2021-03-02 LAB — HEMOGLOBIN A1C: Hgb A1c MFr Bld: 5.7 % (ref 4.6–6.5)

## 2021-03-02 MED ORDER — METOPROLOL TARTRATE 50 MG PO TABS: 50.0000 mg | ORAL_TABLET | Freq: Two times a day (BID) | ORAL | 0 refills | Status: DC

## 2021-03-02 NOTE — Telephone Encounter (Signed)
Received faxed message from [facility] requesting a 90-day refill.   E-scribed refill.

## 2021-03-02 NOTE — Patient Instructions (Signed)
Medication Instructions:  No changes  If you need a refill on your cardiac medications before your next appointment, please call your pharmacy.    Lab work: No new labs needed   If you have labs (blood work) drawn today and your tests are completely normal, you will receive your results only by: . MyChart Message (if you have MyChart) OR . A paper copy in the mail If you have any lab test that is abnormal or we need to change your treatment, we will call you to review the results.   Testing/Procedures: No new testing needed   Follow-Up: At [facility], you and your health needs are our priority.  As part of our continuing mission to provide you with exceptional heart care, we have created designated Provider Care Teams.  These Care Teams include your primary Cardiologist (physician) and Advanced Practice Providers (APPs -  Physician Assistants and Nurse Practitioners) who all work together to provide you with the care you need, when you need it.  . You will need a follow up appointment in 12 months  . Providers on your designated Care Team:   . Christopher Berge, NP . Ryan Dunn, PA-C . Jacquelyn Visser, PA-C  Any Other Special Instructions Will Be Listed Below (If Applicable).  COVID-19 Vaccine Information can be found at: [email or web address] For questions related to vaccine distribution or appointments, please email [email or web address] or call [phone].     

## 2021-03-02 NOTE — Addendum Note (Signed)
Addended by: Alvina Chou on: 03/02/2021 02:59 PM   Modules accepted: Orders

## 2021-03-04 LAB — MICROALBUMIN / CREATININE URINE RATIO
Creatinine,U: 139 mg/dL
Microalb Creat Ratio: 0.5 mg/g (ref 0.0–30.0)
Microalb, Ur: <0.7 mg/dL (ref 0.0–1.9)

## 2021-03-04 NOTE — Addendum Note (Signed)
Addended by: Alvina Chou on: 03/04/2021 07:40 AM   Modules accepted: Orders

## 2021-03-10 ENCOUNTER — Other Ambulatory Visit: Payer: Self-pay

## 2021-03-10 ENCOUNTER — Ambulatory Visit (INDEPENDENT_AMBULATORY_CARE_PROVIDER_SITE_OTHER): Payer: BC Managed Care – PPO | Admitting: Family Medicine

## 2021-03-10 ENCOUNTER — Encounter: Payer: Self-pay | Admitting: Family Medicine

## 2021-03-10 VITALS — BP 124/62 | HR 51 | Temp 98.1°F | Ht 70.0 in | Wt 307.2 lb

## 2021-03-10 DIAGNOSIS — Z6841 Body Mass Index (BMI) 40.0 and over, adult: Secondary | ICD-10-CM

## 2021-03-10 DIAGNOSIS — Z720 Tobacco use: Secondary | ICD-10-CM

## 2021-03-10 DIAGNOSIS — Z0000 Encounter for general adult medical examination without abnormal findings: Secondary | ICD-10-CM

## 2021-03-10 DIAGNOSIS — E785 Hyperlipidemia, unspecified: Secondary | ICD-10-CM

## 2021-03-10 DIAGNOSIS — I10 Essential (primary) hypertension: Secondary | ICD-10-CM

## 2021-03-10 DIAGNOSIS — I739 Peripheral vascular disease, unspecified: Secondary | ICD-10-CM

## 2021-03-10 DIAGNOSIS — R7303 Prediabetes: Secondary | ICD-10-CM | POA: Diagnosis not present

## 2021-03-10 DIAGNOSIS — I480 Paroxysmal atrial fibrillation: Secondary | ICD-10-CM

## 2021-03-10 NOTE — Patient Instructions (Addendum)
Pass by lab to pick up stool kit.  We will refer you for lung cancer screening program  Consider shingrix 2 shot series to help protect from getting shingles Schedule dentist and eye appointments.  Work on [employer] and lifestyle for goal weight loss.  Return as needed or in 1 year for next physical.   Health Maintenance, Male Adopting a healthy lifestyle and getting preventive care are important in promoting health and wellness. Ask your health care provider about:  The right schedule for you to have regular tests and exams.  Things you can do on your own to prevent diseases and keep yourself healthy. What should I know about diet, weight, and exercise? Eat a healthy diet  Eat a diet that includes plenty of vegetables, fruits, low-fat dairy products, and lean protein.  Do not eat a lot of foods that are high in solid fats, added sugars, or sodium.   Maintain a healthy weight Body mass index (BMI) is a measurement that can be used to identify possible weight problems. It estimates body fat based on height and weight. Your health care provider can help determine your BMI and help you achieve or maintain a healthy weight. Get regular exercise Get regular exercise. This is one of the most important things you can do for your health. Most adults should:  Exercise for at least 150 minutes each week. The exercise should increase your heart rate and make you sweat (moderate-intensity exercise).  Do strengthening exercises at least twice a week. This is in addition to the moderate-intensity exercise.  Spend less time sitting. Even light physical activity can be beneficial. Watch cholesterol and blood lipids Have your blood tested for lipids and cholesterol at 60 years of age, then have this test every 5 years. You may need to have your cholesterol levels checked more often if:  Your lipid or cholesterol levels are high.  You are older than 60 years of age.  You are at high risk for  heart disease. What should I know about cancer screening? Many types of cancers can be detected early and may often be prevented. Depending on your health history and family history, you may need to have cancer screening at various ages. This may include screening for:  Colorectal cancer.  Prostate cancer.  Skin cancer.  Lung cancer. What should I know about heart disease, diabetes, and high blood pressure? Blood pressure and heart disease  High blood pressure causes heart disease and increases the risk of stroke. This is more likely to develop in people who have high blood pressure readings, are of African descent, or are overweight.  Talk with your health care provider about your target blood pressure readings.  Have your blood pressure checked: ? Every 3-5 years if you are 26-4 years of age. ? Every year if you are 33 years old or older.  If you are between the ages of 23 and 75 and are a current or former smoker, ask your health care provider if you should have a one-time screening for abdominal aortic aneurysm (AAA). Diabetes Have regular diabetes screenings. This checks your fasting blood sugar level. Have the screening done:  Once every three years after age 73 if you are at a normal weight and have a low risk for diabetes.  More often and at a younger age if you are overweight or have a high risk for diabetes. What should I know about preventing infection? Hepatitis B If you have a higher risk for hepatitis  B, you should be screened for this virus. Talk with your health care provider to find out if you are at risk for hepatitis B infection. Hepatitis C Blood testing is recommended for:  Everyone born from [birth year] through 1965.  Anyone with known risk factors for hepatitis C. Sexually transmitted infections (STIs)  You should be screened each year for STIs, including gonorrhea and chlamydia, if: ? You are sexually active and are younger than 60 years of age. ? You are  older than 60 years of age and your health care provider tells you that you are at risk for this type of infection. ? Your sexual activity has changed since you were last screened, and you are at increased risk for chlamydia or gonorrhea. Ask your health care provider if you are at risk.  Ask your health care provider about whether you are at high risk for HIV. Your health care provider may recommend a prescription medicine to help prevent HIV infection. If you choose to take medicine to prevent HIV, you should first get tested for HIV. You should then be tested every 3 months for as long as you are taking the medicine. Follow these instructions at home: Lifestyle  Do not use any products that contain nicotine or tobacco, such as cigarettes, e-cigarettes, and chewing tobacco. If you need help quitting, ask your health care provider.  Do not use street drugs.  Do not share needles.  Ask your health care provider for help if you need support or information about quitting drugs. Alcohol use  Do not drink alcohol if your health care provider tells you not to drink.  If you drink alcohol: ? Limit how much you have to 0-2 drinks a day. ? Be aware of how much alcohol is in your drink. In the [place] one drink equals one 12 oz bottle of beer (355 mL), one 5 oz glass of wine (148 mL), or one 1 oz glass of hard liquor (44 mL). General instructions  Schedule regular health, dental, and eye exams.  Stay current with your vaccines.  Tell your health care provider if: ? You often feel depressed. ? You have ever been abused or do not feel safe at home. Summary  Adopting a healthy lifestyle and getting preventive care are important in promoting health and wellness.  Follow your health care provider's instructions about healthy diet, exercising, and getting tested or screened for diseases.  Follow your health care provider's instructions on monitoring your cholesterol and blood pressure. This  information is not intended to replace advice given to you by your health care provider. Make sure you discuss any questions you have with your health care provider. Document Revised: 11/14/2018 Document Reviewed: 11/14/2018 Elsevier Patient Education  2021 [employer].

## 2021-03-10 NOTE — Progress Notes (Signed)
Patient ID: Eric Salazar, male    DOB: 1961-08-16, 60 y.o.   MRN: [id number]  This visit was conducted in person.  BP 124/62   Pulse (!) 51   Temp 98.1 F (36.7 C) (Temporal)   Ht 5' 10"  (1.778 m)   Wt (!) 307 lb 3 oz (139.3 kg)   SpO2 98%   BMI 44.08 kg/m    CC: CPE Subjective:   HPI: Eric Salazar is a 60 y.o. male presenting on 03/10/2021 for Annual Exam   Recently saw Dr Rockey Situ - good report Gets yearly CDL   Preventative: Colon cancer screening -iFOB neg 12/2015. Requests rpt iFOB Prostate cancer screening - discussed. Nocturia x1-2, strong stream.  Lung cancer screening - 40 yr history, about 1/2 ppd. Discussed, agrees to referral.  Flu - declines  COVID vaccine Pfizer 02/2020, 03/2020, booster 11/2020 Tdap 07/2011  Shingrix - discussed  Seat belt use discussed  Sunscreen use discussed. No changing moles on skin  Smoker -1-2cig/day  Alcohol - cut back on drinking - no drink in the past month  Dentist - due  Eye exam - due   Caffeine: rare   Lives with wife, son (2002), 3 cats  Occ: Engineering geologist  Edu: 2 years college Activity:walking 30 min/day in am Diet: good water, vegetablesdaily, working on healthy diet changes - sugar free cookies     Relevant past medical, surgical, family and social history reviewed and updated as indicated. Interim medical history since our last visit reviewed. Allergies and medications reviewed and updated. Outpatient Medications Prior to Visit  Medication Sig Dispense Refill  . aspirin EC 81 MG tablet Take 81 mg by mouth daily.    . metoprolol tartrate (LOPRESSOR) 50 MG tablet Take 1 tablet (50 mg total) by mouth 2 (two) times daily. 180 tablet 0  . Multiple Vitamin (MULTIVITAMIN WITH MINERALS) TABS tablet Take 1 tablet by mouth daily.    . Potassium 99 MG TABS Take 2 tablets by mouth daily.    . vitamin C (ASCORBIC ACID) 500 MG tablet Take 500 mg by mouth daily. As needed     No facility-administered  medications prior to visit.     Per HPI unless specifically indicated in ROS section below Review of Systems  Constitutional: Negative for activity change, appetite change, chills, fatigue, fever and unexpected weight change.  HENT: Negative for hearing loss.   Eyes: Negative for visual disturbance.  Respiratory: Negative for cough, chest tightness, shortness of breath and wheezing.   Cardiovascular: Negative for chest pain, palpitations and leg swelling.  Gastrointestinal: Negative for abdominal distention, abdominal pain, blood in stool, constipation, diarrhea, nausea and vomiting.  Genitourinary: Negative for difficulty urinating and hematuria.  Musculoskeletal: Negative for arthralgias, myalgias and neck pain.  Skin: Negative for rash.  Neurological: Negative for dizziness, seizures, syncope and headaches.  Hematological: Negative for adenopathy. Does not bruise/bleed easily.  Psychiatric/Behavioral: Negative for dysphoric mood. The patient is not nervous/anxious.    Objective:  BP 124/62   Pulse (!) 51   Temp 98.1 F (36.7 C) (Temporal)   Ht 5' 10"  (1.778 m)   Wt (!) 307 lb 3 oz (139.3 kg)   SpO2 98%   BMI 44.08 kg/m   Wt Readings from Last 3 Encounters:  03/10/21 (!) 307 lb 3 oz (139.3 kg)  03/02/21 (!) 308 lb (139.7 kg)  11/08/19 (!) 312 lb 4 oz (141.6 kg)      Physical Exam Vitals and nursing note  reviewed.  Constitutional:      General: He is not in acute distress.    Appearance: Normal appearance. He is well-developed. He is obese. He is not ill-appearing.  HENT:     Head: Normocephalic and atraumatic.     Right Ear: Hearing, tympanic membrane, ear canal and external ear normal.     Left Ear: Hearing, tympanic membrane, ear canal and external ear normal.     Mouth/Throat:     Pharynx: Uvula midline.  Eyes:     General: No scleral icterus.    Extraocular Movements: Extraocular movements intact.     Conjunctiva/sclera: Conjunctivae normal.     Pupils: Pupils  are equal, round, and reactive to light.  Cardiovascular:     Rate and Rhythm: Normal rate and regular rhythm.     Pulses: Normal pulses.          Radial pulses are 2+ on the right side and 2+ on the left side.     Heart sounds: Normal heart sounds. No murmur heard.   Pulmonary:     Effort: Pulmonary effort is normal. No respiratory distress.     Breath sounds: Normal breath sounds. No wheezing, rhonchi or rales.  Abdominal:     General: Bowel sounds are normal. There is no distension.     Palpations: Abdomen is soft. There is no mass.     Tenderness: There is no abdominal tenderness. There is no guarding or rebound.     Hernia: No hernia is present.  Musculoskeletal:        General: Normal range of motion.     Cervical back: Normal range of motion and neck supple.     Right lower leg: No edema.     Left lower leg: No edema.  Lymphadenopathy:     Cervical: No cervical adenopathy.  Skin:    General: Skin is warm and dry.     Findings: No rash.  Neurological:     General: No focal deficit present.     Mental Status: He is alert and oriented to person, place, and time.     Comments: CN grossly intact, station and gait intact  Psychiatric:        Mood and Affect: Mood normal.        Behavior: Behavior normal.        Thought Content: Thought content normal.        Judgment: Judgment normal.       Results for orders placed or performed in visit on 03/02/21  PSA  Result Value Ref Range   PSA 2.42 0.10 - 4.00 ng/mL  Hemoglobin A1c  Result Value Ref Range   Hgb A1c MFr Bld 5.7 4.6 - 6.5 %  Comprehensive metabolic panel  Result Value Ref Range   Sodium 139 135 - 145 mEq/L   Potassium 4.5 3.5 - 5.1 mEq/L   Chloride 102 96 - 112 mEq/L   CO2 33 (H) 19 - 32 mEq/L   Glucose, Bld 113 (H) 70 - 99 mg/dL   BUN 21 6 - 23 mg/dL   Creatinine, Ser 1.04 0.40 - 1.50 mg/dL   Total Bilirubin 1.0 0.2 - 1.2 mg/dL   Alkaline Phosphatase 85 39 - 117 U/L   AST 25 0 - 37 U/L   ALT 32 0 - 53  U/L   Total Protein 6.7 6.0 - 8.3 g/dL   Albumin 3.9 3.5 - 5.2 g/dL   GFR 78.67 >60.00 mL/min   Calcium 9.3 8.4 -  10.5 mg/dL  Lipid panel  Result Value Ref Range   Cholesterol 190 0 - 200 mg/dL   Triglycerides 92.0 0.0 - 149.0 mg/dL   HDL 45.30 >39.00 mg/dL   VLDL 18.4 0.0 - 40.0 mg/dL   LDL Cholesterol 126 (H) 0 - 99 mg/dL   Total CHOL/HDL Ratio 4    NonHDL 144.89   Microalbumin / creatinine urine ratio  Result Value Ref Range   Microalb, Ur <0.7 0.0 - 1.9 mg/dL   Creatinine,U 139.0 mg/dL   Microalb Creat Ratio 0.5 0.0 - 30.0 mg/g   Assessment & Plan:  This visit occurred during the SARS-CoV-2 public health emergency.  Safety protocols were in place, including screening questions prior to the visit, additional usage of staff PPE, and extensive cleaning of exam room while observing appropriate contact time as indicated for disinfecting solutions.   Problem List Items Addressed This Visit    Tobacco use    Minimal smoker - encouraged full cessation  Interested in lung cancer screening program - will refer.      Relevant Orders   Ambulatory Referral for Lung Cancer Scre   Morbid obesity with BMI of 40.0-44.9, adult ([place])    Reviewed healthy diet and lifestyle changes to affect sustainable weight loss.       Healthcare maintenance - Primary    Preventative protocols reviewed and updated unless pt declined. Discussed healthy diet and lifestyle.       Prediabetes    rec avoiding added sugar and carbs in diet.       Essential hypertension    Chronic, stable. Continue current regimen.       Paroxysmal atrial fibrillation (HCC)    On aspirin, metoprolol.  Sees cards yearly.       Mild hyperlipidemia    Chronic, mild off meds. Will need to review ASCVD risk at f/u visit.  The 10-year ASCVD risk score Mikey Bussing DC Brooke Bonito., et al., 2013) is: 14.6%   Values used to calculate the score:     Age: 3 years     Sex: Male     Is Non-Hispanic African American: No     Diabetic: No      Tobacco smoker: Yes     Systolic Blood Pressure: 237 mmHg     Is BP treated: Yes     HDL Cholesterol: 45.3 mg/dL     Total Cholesterol: 190 mg/dL       PAD (peripheral artery disease) ([place])    On recent life screen - continue aspirin. No claudication symptoms.           No orders of the defined types were placed in this encounter.  Orders Placed This Encounter  Procedures  . Ambulatory Referral for Lung Cancer Scre    Referral Priority:   Routine    Referral Type:   Consultation    Referral Reason:   Specialty Services Required    Number of Visits Requested:   1    Patient instructions: Pass by lab to pick up stool kit.  We will refer you for lung cancer screening program  Consider shingrix 2 shot series to help protect from getting shingles Schedule dentist and eye appointments.  Work on [employer] and lifestyle for goal weight loss.  Return as needed or in 1 year for next physical.   Follow up plan: Return in about 1 year (around 03/10/2022) for annual exam, prior fasting for blood work.  Ria Bush, MD

## 2021-03-10 NOTE — Assessment & Plan Note (Signed)
On aspirin, metoprolol.  Sees cards yearly.

## 2021-03-10 NOTE — Assessment & Plan Note (Signed)
Reviewed healthy diet and lifestyle changes to affect sustainable weight loss.  

## 2021-03-10 NOTE — Assessment & Plan Note (Signed)
On recent life screen - continue aspirin. No claudication symptoms.

## 2021-03-10 NOTE — Assessment & Plan Note (Signed)
Minimal smoker - encouraged full cessation  Interested in lung cancer screening program - will refer.

## 2021-03-10 NOTE — Assessment & Plan Note (Signed)
Preventative protocols reviewed and updated unless pt declined. Discussed healthy diet and lifestyle.  

## 2021-03-10 NOTE — Assessment & Plan Note (Signed)
rec avoiding added sugar and carbs in diet.

## 2021-03-10 NOTE — Assessment & Plan Note (Addendum)
Chronic, mild off meds. Will need to review ASCVD risk at f/u visit.  The 10-year ASCVD risk score Denman George DC Montez Hageman., et al., 2013) is: 14.6%   Values used to calculate the score:     Age: 60 years     Sex: Male     Is Non-Hispanic African American: No     Diabetic: No     Tobacco smoker: Yes     Systolic Blood Pressure: 124 mmHg     Is BP treated: Yes     HDL Cholesterol: 45.3 mg/dL     Total Cholesterol: 190 mg/dL

## 2021-03-10 NOTE — Assessment & Plan Note (Signed)
Chronic, stable. Continue current regimen. 

## 2021-03-15 ENCOUNTER — Telehealth: Payer: Self-pay | Admitting: *Deleted

## 2021-03-15 NOTE — Telephone Encounter (Signed)
Received referral for low dose lung cancer screening CT scan. Message left at phone number listed in EMR for patient to call me back to facilitate scheduling scan.  

## 2021-03-22 ENCOUNTER — Encounter: Payer: Self-pay | Admitting: *Deleted

## 2021-03-24 ENCOUNTER — Telehealth: Payer: Self-pay | Admitting: *Deleted

## 2021-03-24 NOTE — Telephone Encounter (Signed)
Received referral for low dose lung cancer screening CT scan. Message left at phone number listed in EMR for patient to call me back to facilitate scheduling scan.  

## 2021-03-29 ENCOUNTER — Encounter: Payer: Self-pay | Admitting: *Deleted

## 2021-04-16 ENCOUNTER — Other Ambulatory Visit: Payer: Self-pay | Admitting: Family Medicine

## 2021-07-25 ENCOUNTER — Encounter: Payer: Self-pay | Admitting: Family Medicine

## 2021-07-26 MED ORDER — METOPROLOL TARTRATE 50 MG PO TABS: 50.0000 mg | ORAL_TABLET | Freq: Two times a day (BID) | ORAL | 1 refills | Status: DC

## 2021-08-26 ENCOUNTER — Telehealth: Payer: Self-pay | Admitting: Cardiovascular Disease

## 2021-08-26 NOTE — Telephone Encounter (Signed)
Spoke with pt.  Pt denies SOB right now at time of call.  Pt walks every morning before work and had incr SOB after walking. Yesterday pt with DOE and felt "cold sensation" in my left arm that lasted ~2 hours.  Pt laid down and went to sleep and woke up this morning with no left arm sensation.  Denies chest pain.   Pt last seen by Dr. Mariah Milling in office 03/02/21.  H/o PAF, HTN, smoking hx and heavy alcohol per notes.   Pt reports being unable to tell if he is in a fib.  BP this morning 145/80 "HR in the 60s" Pt confirms taking all meds per med list including Lopressor 50 mg BID and ASA 81 mg qd.   Pt states that he does have SOB intermittently, however, he is concerned now with the left arm numbness sensation that occurred yesterday.  Pt feels stable at this time.  Offered appt with Dr. Mariah Milling tomorrow and pt declined.  States he and his wife are going out of town tomorrow but would like to be seen early next week.  Scheduled appt with Dr. Mariah Milling Monday 9/26 at 9:40 AM.   Advised pt monitor his BP/HR and symptoms.  If recurrent s/s, develops chest pain, worsening sob or becomes unstable to go to the emergency room.  Otherwise pt will follow up as scheduled per pt request to postpone appointment.  Pt has no further questions or concerns at this time.

## 2021-08-26 NOTE — Telephone Encounter (Signed)
Pt c/o Shortness Of Breath: STAT if SOB developed within the last 24 hours or pt is noticeably SOB on the phone  1. Are you currently SOB (can you hear that pt is SOB on the phone)? no  2. How long have you been experiencing SOB? Couple days  3. Are you SOB when sitting or when up moving around? sitting  4. Are you currently experiencing any other symptoms? Yesterday, experienced Numbness in left arm

## 2021-08-27 NOTE — Progress Notes (Deleted)
Cardiology Office Note  Date:  08/27/2021   ID:  Edison, Nicholson 05-29-1961, MRN [id number]  PCP:  Eustaquio Boyden, MD   No chief complaint on file.   HPI:  60 year old male with  hypertension ,  Prior smoking history,  previous heavy alcohol use per the notes   admitted January 2017 with chest pain and shortness of breath, found to be in atrial fibrillation with RVR, initial rate control with beta blockers, converting to normal sinus rhythm.  not placed on anticoagulation.  Mild OSA, does not want CPAP He presents today for follow-up of his paroxysmal atrial fibrillation , prior sx of  chest pain  LOV with myself 03/2017 Seen by one of our providers 11/2020  Prior imaging reviewed Cardiac CT for coronary calcium score showed a coronary calcium score of 11, in July 2018  continues to drive a truck No regular exercise program  3-4 alcoholic beverages per week.   smoke an occasional cigarette Weight continues to run high Tries to do some walking and stretching in the morning before his workday  Has 62 year old son finishing first year in college  Lab work performed earlier today reviewed with him Total chol 190, LDL 126 CR 1.03 A1C 5.7 PSA 2.4  Blood pressure little high today, was rushing in Does not check blood pressure at home  EKG personally reviewed by myself on todays visit Shows normal sinus rhythm rate 65 bpm no significant ST-T wave changes  Past medical history reviewed On prior office visit reported having some headaches,  "balloon popping" In his head Had symptoms for many years No clear association with anything  Prior chest pain, none recently  Family history discussed with him on today's visit Mother with MI Dad with CVA  He did have outpatient sleep study,  showing 8 to 9  AHI, not  interested in management of his mild sleep apnea,  does not get daytime somnolence Works different shifts, sleep is always broken  Echocardiogram showed  normal LV function.  Stress testing was nonischemic.  PMH:   has a past medical history of Agatston coronary artery calcium score less than 100, Cellulitis and abscess of left leg (09/24/2018), History of tobacco abuse, Morbid obesity (HCC), Paroxysmal atrial fibrillation (HCC), Precordial chest pain, and Prediabetes.  PSH:    Past Surgical History:  Procedure Laterality Date   CARDIOVASCULAR STRESS TEST  12/2015   low risk study, EF 55-65%   DOBUTAMINE STRESS ECHO  01/2011   for typical/atypical chest pain - good tolerance, 11 METS, no significant abnormalities   MRI lumbar  2008   disc protrusion L4/5 with impingement L5 and probably L4 nerve roots    Current Outpatient Medications  Medication Sig Dispense Refill   aspirin EC 81 MG tablet Take 81 mg by mouth daily.     metoprolol tartrate (LOPRESSOR) 50 MG tablet Take 1 tablet (50 mg total) by mouth 2 (two) times daily. 180 tablet 1   Multiple Vitamin (MULTIVITAMIN WITH MINERALS) TABS tablet Take 1 tablet by mouth daily.     Potassium 99 MG TABS Take 2 tablets by mouth daily.     vitamin C (ASCORBIC ACID) 500 MG tablet Take 500 mg by mouth daily. As needed     No current facility-administered medications for this visit.    Allergies:   Patient has no known allergies.   Social History:  The patient  reports that he has been smoking cigarettes. He has never used smokeless tobacco. He reports current  alcohol use of about 3.0 standard drinks per week. He reports that he does not use drugs.   Family History:   family history includes Coronary artery disease (age of onset: 70) in his mother; Diabetes in his mother; Healthy in his sister; Heart attack in his mother; Hypertension in his father and mother.    Review of Systems: Review of Systems  Constitutional: Negative.   Respiratory: Negative.    Cardiovascular:  Positive for chest pain.  Gastrointestinal: Negative.   Musculoskeletal: Negative.   Neurological:  Positive for  headaches.  Psychiatric/Behavioral: Negative.    All other systems reviewed and are negative.   PHYSICAL EXAM: VS:  There were no vitals taken for this visit. , BMI There is no height or weight on file to calculate BMI. Constitutional:  oriented to person, place, and time. No distress.  HENT:  Head: Grossly normal Eyes:  no discharge. No scleral icterus.  Neck: No JVD, no carotid bruits  Cardiovascular: Regular rate and rhythm, no murmurs appreciated Pulmonary/Chest: Clear to auscultation bilaterally, no wheezes or rails Abdominal: Soft.  no distension.  no tenderness.  Musculoskeletal: Normal range of motion Neurological:  normal muscle tone. Coordination normal. No atrophy Skin: Skin warm and dry Psychiatric: normal affect, pleasant  Recent Labs: 03/02/2021: ALT 32; BUN 21; Creatinine, Ser 1.04; Potassium 4.5; Sodium 139    Lipid Panel Lab Results  Component Value Date   CHOL 190 03/02/2021   HDL 45.30 03/02/2021   LDLCALC 126 (H) 03/02/2021   TRIG 92.0 03/02/2021      Wt Readings from Last 3 Encounters:  03/10/21 (!) 307 lb 3 oz (139.3 kg)  03/02/21 (!) 308 lb (139.7 kg)  11/08/19 (!) 312 lb 4 oz (141.6 kg)      ASSESSMENT AND PLAN:  Essential hypertension Blood pressure running high, Weight has trended higher over the past 2 years Recommended calorie restriction, low sodium, monitor blood pressure at home If it continues to run high I recommend he call our office, we would need to optimize his blood pressure, especially prior to [facility] January 2023  Paroxysmal atrial fibrillation (HCC) Denies sx,  Denies any tachypalpitations  Ex-smoker  smoking several cigarettes per day, 2-3 Smoking cessation recommended  Body mass index (BMI) 40.0-44.9, adult (HCC) Weight has trended upwards over the past 2 years, will recommended low carbohydrate diet  SOB (shortness of breath) Weight higher No regular exercise program. Recommended weight loss Recommend regular  walking program  Mild sleep apnea We have encouraged continued exercise, careful diet management in an effort to lose weight.  Nonintractable headache, unspecified chronicity pattern, unspecified headache type Not discussed on today's visit  Chest pain, unspecified type No recent sx, Low calcium score   Total encounter time more than 25 minutes  Greater than 50% was spent in counseling and coordination of care with the patient   No orders of the defined types were placed in this encounter.    Signed, Dossie Arbour, M.D., Ph.D. 08/27/2021  [facility] [place], [place] [phone]

## 2021-08-30 ENCOUNTER — Ambulatory Visit: Payer: BC Managed Care – PPO | Admitting: Cardiovascular Disease

## 2021-08-30 DIAGNOSIS — I739 Peripheral vascular disease, unspecified: Secondary | ICD-10-CM

## 2021-08-30 DIAGNOSIS — I480 Paroxysmal atrial fibrillation: Secondary | ICD-10-CM

## 2021-08-30 DIAGNOSIS — I10 Essential (primary) hypertension: Secondary | ICD-10-CM

## 2021-08-30 DIAGNOSIS — Z720 Tobacco use: Secondary | ICD-10-CM

## 2021-08-30 DIAGNOSIS — Z7289 Other problems related to lifestyle: Secondary | ICD-10-CM

## 2021-08-30 DIAGNOSIS — E785 Hyperlipidemia, unspecified: Secondary | ICD-10-CM

## 2022-03-04 ENCOUNTER — Other Ambulatory Visit: Payer: Self-pay | Admitting: Family Medicine

## 2022-03-04 NOTE — Telephone Encounter (Signed)
Please call patient and schedule CPE.  

## 2022-04-07 NOTE — Telephone Encounter (Signed)
LVM to call the office

## 2022-05-24 ENCOUNTER — Other Ambulatory Visit: Payer: Self-pay | Admitting: Family Medicine

## 2022-06-29 ENCOUNTER — Ambulatory Visit (INDEPENDENT_AMBULATORY_CARE_PROVIDER_SITE_OTHER): Payer: BC Managed Care – PPO | Admitting: Medical

## 2022-06-29 ENCOUNTER — Ambulatory Visit: Payer: BC Managed Care – PPO | Admitting: Family Medicine

## 2022-06-29 ENCOUNTER — Encounter: Payer: Self-pay | Admitting: Medical

## 2022-06-29 VITALS — BP 140/90 | HR 53 | Ht 70.0 in | Wt 298.0 lb

## 2022-06-29 DIAGNOSIS — I480 Paroxysmal atrial fibrillation: Secondary | ICD-10-CM

## 2022-06-29 DIAGNOSIS — I10 Essential (primary) hypertension: Secondary | ICD-10-CM | POA: Diagnosis not present

## 2022-06-29 DIAGNOSIS — Z720 Tobacco use: Secondary | ICD-10-CM

## 2022-06-29 DIAGNOSIS — R0683 Snoring: Secondary | ICD-10-CM

## 2022-06-29 DIAGNOSIS — Z789 Other specified health status: Secondary | ICD-10-CM

## 2022-06-29 NOTE — Patient Instructions (Signed)
Medication Instructions:  Your physician recommends that you continue on your current medications as directed. Please refer to the Current Medication list given to you today.  *If you need a refill on your cardiac medications before your next appointment, please call your pharmacy*   Lab Work: None ordered If you have labs (blood work) drawn today and your tests are completely normal, you will receive your results only by: MyChart Message (if you have MyChart) OR A paper copy in the mail If you have any lab test that is abnormal or we need to change your treatment, we will call you to review the results.   Testing/Procedures: None ordered   Follow-Up: At [facility], you and your health needs are our priority.  As part of our continuing mission to provide you with exceptional heart care, we have created designated Provider Care Teams.  These Care Teams include your primary Cardiologist (physician) and Advanced Practice Providers (APPs -  Physician Assistants and Nurse Practitioners) who all work together to provide you with the care you need, when you need it.  We recommend signing up for the patient portal called "MyChart".  Sign up information is provided on this After Visit Summary.  MyChart is used to connect with patients for Virtual Visits (Telemedicine).  Patients are able to view lab/test results, encounter notes, upcoming appointments, etc.  Non-urgent messages can be sent to your provider as well.   To learn more about what you can do with MyChart, go to [email or web address].    Your next appointment:   Your physician wants you to follow-up in: 1 year You will receive a reminder letter in the mail two months in advance. If you don't receive a letter, please call our office to schedule the follow-up appointment.   You have been referred to [facility] for sleep study consideration. Their office will call you to schedule.    The format for your next appointment:    In Person  Provider:   You may see Julien Nordmann, MD or one of the following Advanced Practice Providers on your designated Care Team:   Nicolasa Ducking, NP Eula Listen, PA-C Cadence Fransico [place], [place]   Other Instructions N/A  Important Information About Sugar

## 2022-06-29 NOTE — Progress Notes (Signed)
Cardiology Office Note:    Date:  06/29/2022   ID:  Eric Salazar, DOB 05-27-1961, MRN [id number]  PCP:  Eustaquio Boyden, MD  [facility] Cardiologist:  Eric Nordmann, MD  [facility] Electrophysiologist:  None   Referring MD: Eustaquio Boyden, MD   Chief Complaint: 12 month follow-up  History of Present Illness:    Eric Salazar is a 61 y.o. male with a hx of HTN, prior smoking history, prior alcohol use, one incidence of afib not on a/c, OSA not on CPAP who presents for follow-up.   Cardiac CTA showed coronary calcium score of 11 in July 2018. Echo in 2017 showed LVEF 60-65%, , no WMA.   Stress test 2021with no significant ischemia, normal LVEF 55%, CT imaging with no significant coronary calcifications,  verall low risk scan.    Last seen 02/2021 and was overall doing OK. BP was a little high. Lifestyle changes were recommended.  Today, the patient is overall doing well. No changes since the last visit. He has a physical scheduled with his PCP next month, they will do labs then BP is slightly elevated today. At home 130/90. Patient will walk 20-30 minutes before work. Diet can be better. He stopped drinking alcohol within the last month. He smokes 2-3 cigarettes daily.    Past Medical History:  Diagnosis Date   Agatston coronary artery calcium score less than 100    a. 06/2017 Cardiac CT: Cor Ca2+ = 11-- 54th%'ile.   Cellulitis and abscess of left leg 09/24/2018   History of tobacco abuse    a.Quit 2016.   Morbid obesity (HCC)    Paroxysmal atrial fibrillation (HCC)    a. 12/2015 Echo: EF 60-65%, no rwma; b. Converted on oral BB; c. CHA2DS2VASc = 0-->on ASA 81.   Precordial chest pain    a. 01/2011 Dobutamine Echo: No rwma->nl; b. 12/2015 Recurrent c/p in setting of AF RVR-->MV: mid ant/apical ant fixed defect, likely artifact. No ischemia. EF 55-65%.   Prediabetes     Past Surgical History:  Procedure Laterality Date   CARDIOVASCULAR STRESS TEST  12/06/2015    low risk study, EF 55-65%   DOBUTAMINE STRESS ECHO  01/05/2011   for typical/atypical chest pain - good tolerance, 11 METS, no significant abnormalities   MRI lumbar  12/05/2006   disc protrusion L4/5 with impingement L5 and probably L4 nerve roots   MULTIPLE TOOTH EXTRACTIONS      Current Medications: Current Meds  Medication Sig   aspirin EC 81 MG tablet Take 81 mg by mouth daily.   metoprolol tartrate (LOPRESSOR) 50 MG tablet TAKE 1 TABLET BY MOUTH TWICE  DAILY   Multiple Vitamin (MULTIVITAMIN WITH MINERALS) TABS tablet Take 1 tablet by mouth daily.   Potassium 99 MG TABS Take 2 tablets by mouth daily.   vitamin C (ASCORBIC ACID) 500 MG tablet Take 500 mg by mouth daily. As needed     Allergies:   Patient has no known allergies.   Social History   Socioeconomic History   Marital status: Married    Spouse name: Not on file   Number of children: 1   Years of education: 2 yr coll   Highest education level: Not on file  Occupational History   Occupation: Event organiser: the pant[employer]    Comment: manages convenience store  Tobacco Use   Smoking status: Every Day    Types: Cigarettes   Smokeless tobacco: Never   Tobacco comments:  2-3 cigarettes per day  Vaping Use   Vaping Use: Never used  Substance and Sexual Activity   Alcohol use: Yes    Alcohol/week: 3.0 standard drinks of alcohol    Types: 3 Shots of liquor per week    Comment: occasionally   Drug use: No   Sexual activity: Not on file  Other Topics Concern   Not on file  Social History Narrative   Caffeine: rareLives with wife, son (2002), 3 Art therapist.Edu: 2 years collegeActivity: not much, at work, playing with sonDiet: good fruits/vegetables   Social Determinants of Corporate investment banker Strain: Not on file  Food Insecurity: Not on file  Transportation Needs: Not on file  Physical Activity: Not on file  Stress: Not on file  Social Connections: Not on file     Family  History: The patient's family history includes Coronary artery disease (age of onset: 79) in his mother; Diabetes in his mother; Healthy in his sister; Heart attack in his mother; Hypertension in his father and mother. There is no history of Cancer.  ROS:   Please see the history of present illness.     All other systems reviewed and are negative.  EKGs/Labs/Other Studies Reviewed:    The following studies were reviewed today:  MPI 2021 Narrative & Impression  Pharmacological myocardial perfusion imaging study with no significant  ischemia Normal wall motion, EF estimated at 55% GI uptake artifact noted No EKG changes concerning for ischemia at peak stress or in recovery. CT attenuation correction images with no significant coronary calcification, no significant aortic atherosclerosis Low risk scan    Cardiac CT scoring 2018 CT coronary calcium scoring Score is very low, 11 This indicates very minimal amount of coronary calcified plaque noted likely from the time he was smoking Does not need further testing at this time, Would continue smoking cessation, lifestyle modification Primary care can check cholesterol level  Echo 2017 Study Conclusions   - Left ventricle: The cavity size was normal. There was mild    concentric hypertrophy. Systolic function was normal. The    estimated ejection fraction was in the range of 60% to 65%. Wall    motion was normal; there were no regional wall motion    abnormalities.   EKG:  EKG is ordered today.  The ekg ordered today demonstrates SB 53bpm, LAD, TWI III, no changes  Recent Labs: No results found for requested labs within last 365 days.  Recent Lipid Panel    Component Value Date/Time   CHOL 190 03/02/2021 0744   CHOL 175 08/08/2003 0000   TRIG 92.0 03/02/2021 0744   TRIG 72 08/08/2003 0000   HDL 45.30 03/02/2021 0744   CHOLHDL 4 03/02/2021 0744   VLDL 18.4 03/02/2021 0744   LDLCALC 126 (H) 03/02/2021 0744   LDLDIRECT 106  08/08/2003 0000     Physical Exam:    VS:  BP 140/90 (BP Location: Left Arm, Patient Position: Sitting, Cuff Size: Large)   Pulse (!) 53   Ht 5\' 10"  (1.778 m)   Wt 298 lb (135.2 kg)   SpO2 98%   BMI 42.76 kg/m     Wt Readings from Last 3 Encounters:  06/29/22 298 lb (135.2 kg)  03/10/21 (!) 307 lb 3 oz (139.3 kg)  03/02/21 (!) 308 lb (139.7 kg)     GEN:  Well nourished, well developed in no acute distress HEENT: Normal NECK: No JVD; No carotid bruits LYMPHATICS: No lymphadenopathy CARDIAC:  bradycardia, RR, no murmurs, rubs, gallops RESPIRATORY:  Clear to auscultation without rales, wheezing or rhonchi  ABDOMEN: Soft, non-tender, non-distended MUSCULOSKELETAL:  No edema; No deformity  SKIN: Warm and dry NEUROLOGIC:  Alert and oriented x 3 PSYCHIATRIC:  Normal affect   ASSESSMENT:    1. Essential hypertension   2. Snoring   3. Paroxysmal atrial fibrillation (HCC)   4. Tobacco use   5. Alcohol use    PLAN:    In order of problems listed above:  HTN BP mildly elevated today. Recommended weight loss and lifestyle changes. Continue Lopressor 50mg  BID.   Afib one occurrence EKG today shows SB with a heart rate of 53bpm. He denies palpitations. Continue Lopressor for rate control.   OSA Reports prior sleep test was negative, but this was many years ago. I will refer him to pulmonary for repeat sleep study.   Tobacco use He is down to smoking 2-3 cigarettes daily.   H/o alcohol use He stopped drinking within the last month  Disposition: Follow up in 1 year(s) with MD/APP   Signed, Jarielys Girardot , PA-C  06/29/2022 11:28 AM    [facility]

## 2022-07-23 ENCOUNTER — Other Ambulatory Visit: Payer: Self-pay | Admitting: Family Medicine

## 2022-07-23 DIAGNOSIS — R7303 Prediabetes: Secondary | ICD-10-CM

## 2022-07-23 DIAGNOSIS — I10 Essential (primary) hypertension: Secondary | ICD-10-CM

## 2022-07-23 DIAGNOSIS — E785 Hyperlipidemia, unspecified: Secondary | ICD-10-CM

## 2022-07-23 DIAGNOSIS — Z125 Encounter for screening for malignant neoplasm of prostate: Secondary | ICD-10-CM

## 2022-07-26 ENCOUNTER — Other Ambulatory Visit (INDEPENDENT_AMBULATORY_CARE_PROVIDER_SITE_OTHER): Payer: BC Managed Care – PPO

## 2022-07-26 DIAGNOSIS — E785 Hyperlipidemia, unspecified: Secondary | ICD-10-CM

## 2022-07-26 DIAGNOSIS — Z125 Encounter for screening for malignant neoplasm of prostate: Secondary | ICD-10-CM | POA: Diagnosis not present

## 2022-07-26 DIAGNOSIS — R7303 Prediabetes: Secondary | ICD-10-CM | POA: Diagnosis not present

## 2022-07-26 DIAGNOSIS — I10 Essential (primary) hypertension: Secondary | ICD-10-CM

## 2022-07-26 NOTE — Addendum Note (Signed)
Addended by: Alvina Chou on: 07/26/2022 04:48 PM   Modules accepted: Orders

## 2022-07-27 ENCOUNTER — Other Ambulatory Visit: Payer: BC Managed Care – PPO

## 2022-07-27 LAB — COMPREHENSIVE METABOLIC PANEL
ALT: 26 U/L (ref 0–53)
AST: 24 U/L (ref 0–37)
Albumin: 3.9 g/dL (ref 3.5–5.2)
Alkaline Phosphatase: 76 U/L (ref 39–117)
BUN: 16 mg/dL (ref 6–23)
CO2: 29 mEq/L (ref 19–32)
Calcium: 8.9 mg/dL (ref 8.4–10.5)
Chloride: 101 mEq/L (ref 96–112)
Creatinine, Ser: 1.07 mg/dL (ref 0.40–1.50)
GFR: 75.29 mL/min (ref >60.00)
Glucose, Bld: 109 mg/dL — ABNORMAL HIGH (ref 70–99)
Potassium: 4 mEq/L (ref 3.5–5.1)
Sodium: 138 mEq/L (ref 135–145)
Total Bilirubin: 1 mg/dL (ref 0.2–1.2)
Total Protein: 6.5 g/dL (ref 6.0–8.3)

## 2022-07-27 LAB — LIPID PANEL
Cholesterol: 166 mg/dL (ref 0–200)
HDL: 40.7 mg/dL (ref >39.00)
LDL Cholesterol: 107 mg/dL — ABNORMAL HIGH (ref 0–99)
NonHDL: 125.09
Total CHOL/HDL Ratio: 4
Triglycerides: 88 mg/dL (ref 0.0–149.0)
VLDL: 17.6 mg/dL (ref 0.0–40.0)

## 2022-07-27 LAB — PSA: PSA: 2.8 ng/mL (ref 0.10–4.00)

## 2022-07-27 LAB — HEMOGLOBIN A1C: Hgb A1c MFr Bld: 5.8 % (ref 4.6–6.5)

## 2022-07-28 LAB — MICROALBUMIN / CREATININE URINE RATIO
Creatinine,U: 44.1 mg/dL
Microalb Creat Ratio: 1.6 mg/g (ref 0.0–30.0)
Microalb, Ur: <0.7 mg/dL (ref 0.0–1.9)

## 2022-08-02 ENCOUNTER — Encounter: Payer: Self-pay | Admitting: Family Medicine

## 2022-08-02 ENCOUNTER — Ambulatory Visit (INDEPENDENT_AMBULATORY_CARE_PROVIDER_SITE_OTHER): Payer: BC Managed Care – PPO | Admitting: Family Medicine

## 2022-08-02 VITALS — BP 128/72 | HR 76 | Temp 97.9°F | Ht 70.0 in | Wt 291.5 lb

## 2022-08-02 DIAGNOSIS — Z6841 Body Mass Index (BMI) 40.0 and over, adult: Secondary | ICD-10-CM

## 2022-08-02 DIAGNOSIS — Z720 Tobacco use: Secondary | ICD-10-CM

## 2022-08-02 DIAGNOSIS — Z23 Encounter for immunization: Secondary | ICD-10-CM | POA: Diagnosis not present

## 2022-08-02 DIAGNOSIS — R7303 Prediabetes: Secondary | ICD-10-CM

## 2022-08-02 DIAGNOSIS — E785 Hyperlipidemia, unspecified: Secondary | ICD-10-CM

## 2022-08-02 DIAGNOSIS — I10 Essential (primary) hypertension: Secondary | ICD-10-CM

## 2022-08-02 DIAGNOSIS — Z0000 Encounter for general adult medical examination without abnormal findings: Secondary | ICD-10-CM

## 2022-08-02 DIAGNOSIS — I480 Paroxysmal atrial fibrillation: Secondary | ICD-10-CM

## 2022-08-02 DIAGNOSIS — Z1211 Encounter for screening for malignant neoplasm of colon: Secondary | ICD-10-CM

## 2022-08-02 MED ORDER — METOPROLOL TARTRATE 50 MG PO TABS
50.0000 mg | ORAL_TABLET | Freq: Two times a day (BID) | ORAL | 3 refills | Status: DC
Start: 2022-08-02 — End: 2022-08-18

## 2022-08-02 NOTE — Assessment & Plan Note (Signed)
Congratulated on weight loss to date. Encouraged ongoing healthy diet and lifestyle choices to affect sustainable weight loss.  °

## 2022-08-02 NOTE — Assessment & Plan Note (Signed)
Chronic, stable on metoprolol - continue  

## 2022-08-02 NOTE — Progress Notes (Signed)
Patient ID: Eric Salazar, male    DOB: December 12, 1960, 61 y.o.   MRN: [id number]  This visit was conducted in person.  BP 128/72   Pulse 76   Temp 97.9 F (36.6 C) (Temporal)   Ht _0  (1.778 m)   Wt 291 lb 8 oz (132.2 kg)   SpO2 97%   BMI 41.83 kg/m    CC: CPE Subjective:   HPI: Eric Salazar is a 61 y.o. male presenting on 08/02/2022 for Annual Exam   Yearly CDL.  18 lb drop in the past year - smaller portions, doesn't eat during day when driving, 22-02 min walk in the mornings at brisk pace.   Corn to left lateral foot - asks about home care advice  Preventative: Colon cancer screening - iFOB neg 12/2015. Requests rpt iFOB Prostate cancer screening - discussed. Yearly PSA. Nocturia x1-2, strong stream.  Lung cancer screening - 40 yr history, about 1/2 ppd. referred last year, never completed. Interested in re-referral.  Flu - declines  [place] 02/2020, 03/2020, booster 11/2020  Tdap 07/2011, update today  Shingrix - discussed  Seat belt use discussed  Sunscreen use discussed. No changing moles on skin  Sleep - averaging 7-8 hours/night Smoker - 1-2 cig/day  Alcohol - rare  Dentist - has partial denture, sees regularly Eye exam - yearly     Caffeine: rare   Lives with wife, son (2002), 3 cats  Occ: Engineering geologist  Edu: 2 years college Activity: walking 30 min/day in am  Diet: good water, vegetables daily, working on healthy diet changes - sugar free cookies     Relevant past medical, surgical, family and social history reviewed and updated as indicated. Interim medical history since our last visit reviewed. Allergies and medications reviewed and updated. Outpatient Medications Prior to Visit  Medication Sig Dispense Refill   aspirin EC 81 MG tablet Take 81 mg by mouth daily.     Multiple Vitamin (MULTIVITAMIN WITH MINERALS) TABS tablet Take 1 tablet by mouth daily.     Potassium 99 MG TABS Take 2 tablets by mouth daily.      vitamin C (ASCORBIC ACID) 500 MG tablet Take 500 mg by mouth daily. As needed     metoprolol tartrate (LOPRESSOR) 50 MG tablet TAKE 1 TABLET BY MOUTH TWICE  DAILY 180 tablet 0   No facility-administered medications prior to visit.     Per HPI unless specifically indicated in ROS section below Review of Systems  Constitutional:  Negative for activity change, appetite change, chills, fatigue, fever and unexpected weight change.  HENT:  Negative for hearing loss.   Eyes:  Negative for visual disturbance.  Respiratory:  Negative for cough, chest tightness, shortness of breath and wheezing.   Cardiovascular:  Negative for chest pain, palpitations and leg swelling.  Gastrointestinal:  Negative for abdominal distention, abdominal pain, blood in stool, constipation, diarrhea, nausea and vomiting.  Genitourinary:  Negative for difficulty urinating and hematuria.  Musculoskeletal:  Negative for arthralgias, myalgias and neck pain.  Skin:  Negative for rash.  Neurological:  Negative for dizziness, seizures, syncope and headaches.  Hematological:  Negative for adenopathy. Does not bruise/bleed easily.  Psychiatric/Behavioral:  Negative for dysphoric mood. The patient is not nervous/anxious.     Objective:  BP 128/72   Pulse 76   Temp 97.9 F (36.6 C) (Temporal)   Ht _1  (1.778 m)   Wt 291 lb 8 oz (132.2 kg)   SpO2  97%   BMI 41.83 kg/m   Wt Readings from Last 3 Encounters:  08/02/22 291 lb 8 oz (132.2 kg)  06/29/22 298 lb (135.2 kg)  03/10/21 (!) 307 lb 3 oz (139.3 kg)      Physical Exam Vitals and nursing note reviewed.  Constitutional:      General: He is not in acute distress.    Appearance: Normal appearance. He is well-developed. He is obese. He is not ill-appearing.  HENT:     Head: Normocephalic and atraumatic.     Right Ear: Hearing, tympanic membrane, ear canal and external ear normal.     Left Ear: Hearing, tympanic membrane, ear canal and external ear normal.  Eyes:      General: No scleral icterus.    Extraocular Movements: Extraocular movements intact.     Conjunctiva/sclera: Conjunctivae normal.     Pupils: Pupils are equal, round, and reactive to light.  Neck:     Thyroid: No thyroid mass or thyromegaly.  Cardiovascular:     Rate and Rhythm: Normal rate and regular rhythm.     Pulses: Normal pulses.          Radial pulses are 2+ on the right side and 2+ on the left side.     Heart sounds: Normal heart sounds. No murmur heard. Pulmonary:     Effort: Pulmonary effort is normal. No respiratory distress.     Breath sounds: Normal breath sounds. No wheezing, rhonchi or rales.  Abdominal:     General: Bowel sounds are normal. There is no distension.     Palpations: Abdomen is soft. There is no mass.     Tenderness: There is no abdominal tenderness. There is no guarding or rebound.     Hernia: No hernia is present.  Musculoskeletal:        General: Normal range of motion.     Cervical back: Normal range of motion and neck supple.     Right lower leg: No edema.     Left lower leg: No edema.  Lymphadenopathy:     Cervical: No cervical adenopathy.  Skin:    General: Skin is warm and dry.     Findings: No rash.  Neurological:     General: No focal deficit present.     Mental Status: He is alert and oriented to person, place, and time.  Psychiatric:        Mood and Affect: Mood normal.        Behavior: Behavior normal.        Thought Content: Thought content normal.        Judgment: Judgment normal.       Results for orders placed or performed in visit on 07/26/22  Lipid panel  Result Value Ref Range   Cholesterol 166 0 - 200 mg/dL   Triglycerides 88.0 0.0 - 149.0 mg/dL   HDL 40.70 >39.00 mg/dL   VLDL 17.6 0.0 - 40.0 mg/dL   LDL Cholesterol 107 (H) 0 - 99 mg/dL   Total CHOL/HDL Ratio 4    NonHDL 125.09   Comprehensive metabolic panel  Result Value Ref Range   Sodium 138 135 - 145 mEq/L   Potassium 4.0 3.5 - 5.1 mEq/L   Chloride 101 96  - 112 mEq/L   CO2 29 19 - 32 mEq/L   Glucose, Bld 109 (H) 70 - 99 mg/dL   BUN 16 6 - 23 mg/dL   Creatinine, Ser 1.07 0.40 - 1.50 mg/dL   Total Bilirubin  1.0 0.2 - 1.2 mg/dL   Alkaline Phosphatase 76 39 - 117 U/L   AST 24 0 - 37 U/L   ALT 26 0 - 53 U/L   Total Protein 6.5 6.0 - 8.3 g/dL   Albumin 3.9 3.5 - 5.2 g/dL   GFR 75.29 >60.00 mL/min   Calcium 8.9 8.4 - 10.5 mg/dL  Hemoglobin A1c  Result Value Ref Range   Hgb A1c MFr Bld 5.8 4.6 - 6.5 %  PSA  Result Value Ref Range   PSA 2.80 0.10 - 4.00 ng/mL  Microalbumin / creatinine urine ratio  Result Value Ref Range   Microalb, Ur <0.7 0.0 - 1.9 mg/dL   Creatinine,U 44.1 mg/dL   Microalb Creat Ratio 1.6 0.0 - 30.0 mg/g    Assessment & Plan:   Problem List Items Addressed This Visit     Healthcare maintenance - Primary (Chronic)    Preventative protocols reviewed and updated unless pt declined. Discussed healthy diet and lifestyle.       Tobacco use    Minimal smoker.  Refer to lung cancer screen given long smoking history.       Relevant Orders   Ambulatory Referral for Lung Cancer Scre   Morbid obesity with BMI of 40.0-44.9, adult ([place])    Congratulated on weight loss to date. Encouraged ongoing healthy diet and lifestyle choices to affect sustainable weight loss.       Prediabetes    Continue to limit added sugars in diet.       Essential hypertension    Chronic, stable on metoprolol - continue.       Relevant Medications   metoprolol tartrate (LOPRESSOR) 50 MG tablet   Paroxysmal atrial fibrillation (HCC)    Continue aspirin, metoprolol. Appreciate cardiology care.       Relevant Medications   metoprolol tartrate (LOPRESSOR) 50 MG tablet   Mild hyperlipidemia    Chronic, improved readings.  The 10-year ASCVD risk score (Arnett DK, et al., 2019) is: 15.5%   Values used to calculate the score:     Age: 69 years     Sex: Male     Is Non-Hispanic African American: No     Diabetic: No     Tobacco smoker:  Yes     Systolic Blood Pressure: 102 mmHg     Is BP treated: Yes     HDL Cholesterol: 40.7 mg/dL     Total Cholesterol: 166 mg/dL       Relevant Medications   metoprolol tartrate (LOPRESSOR) 50 MG tablet   Other Visit Diagnoses     Special screening for malignant neoplasms, colon       Relevant Orders   Fecal occult blood, imunochemical   Need for Tdap vaccination       Relevant Orders   Tdap vaccine greater than or equal to 7yo IM (Completed)        Meds ordered this encounter  Medications   metoprolol tartrate (LOPRESSOR) 50 MG tablet    Sig: Take 1 tablet (50 mg total) by mouth 2 (two) times daily.    Dispense:  180 tablet    Refill:  3   Orders Placed This Encounter  Procedures   Fecal occult blood, imunochemical    Standing Status:   Future    Standing Expiration Date:   08/03/2023   Tdap vaccine greater than or equal to 7yo IM   Ambulatory Referral for Lung Cancer Scre    Referral Priority:   Routine  Referral Type:   Consultation    Referral Reason:   Specialty Services Required    Number of Visits Requested:   1    Patient instructions: Congrats on weight loss! Tdap today Pass by lab to pick up stool kit We will refer you to lung cancer screening program.  Consider shingles shots.  Return as needed or in 1 year for next physical.   Follow up plan: Return in about 1 year (around 08/03/2023) for annual exam, prior fasting for blood work.  Ria Bush, MD

## 2022-08-02 NOTE — Patient Instructions (Addendum)
Congrats on weight loss! Tdap today Pass by lab to pick up stool kit We will refer you to lung cancer screening program.  Consider shingles shots.  Return as needed or in 1 year for next physical.   Health Maintenance, Male Adopting a healthy lifestyle and getting preventive care are important in promoting health and wellness. Ask your health care provider about: The right schedule for you to have regular tests and exams. Things you can do on your own to prevent diseases and keep yourself healthy. What should I know about diet, weight, and exercise? Eat a healthy diet  Eat a diet that includes plenty of vegetables, fruits, low-fat dairy products, and lean protein. Do not eat a lot of foods that are high in solid fats, added sugars, or sodium. Maintain a healthy weight Body mass index (BMI) is a measurement that can be used to identify possible weight problems. It estimates body fat based on height and weight. Your health care provider can help determine your BMI and help you achieve or maintain a healthy weight. Get regular exercise Get regular exercise. This is one of the most important things you can do for your health. Most adults should: Exercise for at least 150 minutes each week. The exercise should increase your heart rate and make you sweat (moderate-intensity exercise). Do strengthening exercises at least twice a week. This is in addition to the moderate-intensity exercise. Spend less time sitting. Even light physical activity can be beneficial. Watch cholesterol and blood lipids Have your blood tested for lipids and cholesterol at 61 years of age, then have this test every 5 years. You may need to have your cholesterol levels checked more often if: Your lipid or cholesterol levels are high. You are older than 62 years of age. You are at high risk for heart disease. What should I know about cancer screening? Many types of cancers can be detected early and may often be  prevented. Depending on your health history and family history, you may need to have cancer screening at various ages. This may include screening for: Colorectal cancer. Prostate cancer. Skin cancer. Lung cancer. What should I know about heart disease, diabetes, and high blood pressure? Blood pressure and heart disease High blood pressure causes heart disease and increases the risk of stroke. This is more likely to develop in people who have high blood pressure readings or are overweight. Talk with your health care provider about your target blood pressure readings. Have your blood pressure checked: Every 3-5 years if you are 81-58 years of age. Every year if you are 83 years old or older. If you are between the ages of 56 and 20 and are a current or former smoker, ask your health care provider if you should have a one-time screening for abdominal aortic aneurysm (AAA). Diabetes Have regular diabetes screenings. This checks your fasting blood sugar level. Have the screening done: Once every three years after age 60 if you are at a normal weight and have a low risk for diabetes. More often and at a younger age if you are overweight or have a high risk for diabetes. What should I know about preventing infection? Hepatitis B If you have a higher risk for hepatitis B, you should be screened for this virus. Talk with your health care provider to find out if you are at risk for hepatitis B infection. Hepatitis C Blood testing is recommended for: Everyone born from [birth year] through 1965. Anyone with known risk factors for hepatitis  C. Sexually transmitted infections (STIs) You should be screened each year for STIs, including gonorrhea and chlamydia, if: You are sexually active and are younger than 61 years of age. You are older than 61 years of age and your health care provider tells you that you are at risk for this type of infection. Your sexual activity has changed since you were last screened,  and you are at increased risk for chlamydia or gonorrhea. Ask your health care provider if you are at risk. Ask your health care provider about whether you are at high risk for HIV. Your health care provider may recommend a prescription medicine to help prevent HIV infection. If you choose to take medicine to prevent HIV, you should first get tested for HIV. You should then be tested every 3 months for as long as you are taking the medicine. Follow these instructions at home: Alcohol use Do not drink alcohol if your health care provider tells you not to drink. If you drink alcohol: Limit how much you have to 0-2 drinks a day. Know how much alcohol is in your drink. In the [place] one drink equals one 12 oz bottle of beer (355 mL), one 5 oz glass of wine (148 mL), or one 1 oz glass of hard liquor (44 mL). Lifestyle Do not use any products that contain nicotine or tobacco. These products include cigarettes, chewing tobacco, and vaping devices, such as e-cigarettes. If you need help quitting, ask your health care provider. Do not use street drugs. Do not share needles. Ask your health care provider for help if you need support or information about quitting drugs. General instructions Schedule regular health, dental, and eye exams. Stay current with your vaccines. Tell your health care provider if: You often feel depressed. You have ever been abused or do not feel safe at home. Summary Adopting a healthy lifestyle and getting preventive care are important in promoting health and wellness. Follow your health care provider's instructions about healthy diet, exercising, and getting tested or screened for diseases. Follow your health care provider's instructions on monitoring your cholesterol and blood pressure. This information is not intended to replace advice given to you by your health care provider. Make sure you discuss any questions you have with your health care provider. Document Revised:  04/12/2021 Document Reviewed: 04/12/2021 Elsevier Patient Education  [place].

## 2022-08-02 NOTE — Assessment & Plan Note (Addendum)
Minimal smoker.  Refer to lung cancer screen given long smoking history.

## 2022-08-02 NOTE — Assessment & Plan Note (Signed)
Chronic, improved readings.  The 10-year ASCVD risk score (Arnett DK, et al., 2019) is: 15.5%   Values used to calculate the score:     Age: 61 years     Sex: Male     Is Non-Hispanic African American: No     Diabetic: No     Tobacco smoker: Yes     Systolic Blood Pressure: 128 mmHg     Is BP treated: Yes     HDL Cholesterol: 40.7 mg/dL     Total Cholesterol: 166 mg/dL

## 2022-08-02 NOTE — Assessment & Plan Note (Signed)
Continue aspirin, metoprolol. Appreciate cardiology care.

## 2022-08-02 NOTE — Assessment & Plan Note (Signed)
Continue to limit added sugars in diet.  

## 2022-08-02 NOTE — Assessment & Plan Note (Signed)
Preventative protocols reviewed and updated unless pt declined. Discussed healthy diet and lifestyle.  

## 2022-08-18 ENCOUNTER — Other Ambulatory Visit: Payer: Self-pay | Admitting: Family Medicine

## 2022-10-02 ENCOUNTER — Other Ambulatory Visit: Payer: Self-pay

## 2022-10-02 ENCOUNTER — Ambulatory Visit
Admission: EM | Admit: 2022-10-02 | Discharge: 2022-10-02 | Disposition: A | Discharge status: Home / Self Care | Payer: BC Managed Care – PPO

## 2022-10-02 ENCOUNTER — Emergency Department
Admission: EM | Admit: 2022-10-02 | Discharge: 2022-10-02 | Disposition: A | Discharge status: Home / Self Care | Payer: BC Managed Care – PPO | Attending: Emergency Medicine | Admitting: Emergency Medicine

## 2022-10-02 ENCOUNTER — Emergency Department: Payer: BC Managed Care – PPO

## 2022-10-02 ENCOUNTER — Encounter: Payer: Self-pay | Admitting: Emergency Medicine

## 2022-10-02 DIAGNOSIS — L03213 Periorbital cellulitis: Secondary | ICD-10-CM | POA: Insufficient documentation

## 2022-10-02 DIAGNOSIS — R220 Localized swelling, mass and lump, head: Secondary | ICD-10-CM | POA: Diagnosis not present

## 2022-10-02 DIAGNOSIS — I10 Essential (primary) hypertension: Secondary | ICD-10-CM | POA: Insufficient documentation

## 2022-10-02 DIAGNOSIS — D72829 Elevated white blood cell count, unspecified: Secondary | ICD-10-CM | POA: Insufficient documentation

## 2022-10-02 DIAGNOSIS — H02846 Edema of left eye, unspecified eyelid: Secondary | ICD-10-CM

## 2022-10-02 DIAGNOSIS — H05012 Cellulitis of left orbit: Secondary | ICD-10-CM | POA: Diagnosis not present

## 2022-10-02 DIAGNOSIS — L03211 Cellulitis of face: Secondary | ICD-10-CM | POA: Diagnosis not present

## 2022-10-02 DIAGNOSIS — H5789 Other specified disorders of eye and adnexa: Secondary | ICD-10-CM | POA: Diagnosis not present

## 2022-10-02 DIAGNOSIS — K047 Periapical abscess without sinus: Secondary | ICD-10-CM | POA: Diagnosis not present

## 2022-10-02 LAB — CBC
HCT: 42.3 % (ref 39.0–52.0)
Hemoglobin: 14.5 g/dL (ref 13.0–17.0)
MCH: 30.9 pg (ref 26.0–34.0)
MCHC: 34.3 g/dL (ref 30.0–36.0)
MCV: 90.2 fL (ref 80.0–100.0)
Platelets: 147 10*3/uL — ABNORMAL LOW (ref 150–400)
RBC: 4.69 MIL/uL (ref 4.22–5.81)
RDW: 13.3 % (ref 11.5–15.5)
WBC: 11.8 10*3/uL — ABNORMAL HIGH (ref 4.0–10.5)
nRBC: 0 % (ref 0.0–0.2)

## 2022-10-02 LAB — COMPREHENSIVE METABOLIC PANEL
ALT: 86 U/L — ABNORMAL HIGH (ref 0–44)
AST: 66 U/L — ABNORMAL HIGH (ref 15–41)
Albumin: 3.7 g/dL (ref 3.5–5.0)
Alkaline Phosphatase: 80 U/L (ref 38–126)
Anion gap: 10 (ref 5–15)
BUN: 16 mg/dL (ref 6–20)
CO2: 24 mmol/L (ref 22–32)
Calcium: 8.7 mg/dL — ABNORMAL LOW (ref 8.9–10.3)
Chloride: 102 mmol/L (ref 98–111)
Creatinine, Ser: 1 mg/dL (ref 0.61–1.24)
GFR, Estimated: >60 mL/min (ref >60)
Glucose, Bld: 127 mg/dL — ABNORMAL HIGH (ref 70–99)
Potassium: 3.8 mmol/L (ref 3.5–5.1)
Sodium: 136 mmol/L (ref 135–145)
Total Bilirubin: 0.7 mg/dL (ref 0.3–1.2)
Total Protein: 7.6 g/dL (ref 6.5–8.1)

## 2022-10-02 MED ORDER — SULFAMETHOXAZOLE-TRIMETHOPRIM 800-160 MG PO TABS: 1.0000 | ORAL_TABLET | Freq: Two times a day (BID) | ORAL | 0 refills | Status: AC

## 2022-10-02 MED ORDER — AMOXICILLIN 500 MG PO CAPS
1000.0000 mg | ORAL_CAPSULE | Freq: Once | ORAL | Status: AC
  Administered 2022-10-02: 1000 mg via ORAL
  Filled 2022-10-02: qty 2

## 2022-10-02 MED ORDER — AMOXICILLIN 500 MG PO CAPS: 1000.0000 mg | ORAL_CAPSULE | Freq: Two times a day (BID) | ORAL | 0 refills | Status: AC

## 2022-10-02 MED ORDER — IOHEXOL 300 MG/ML  SOLN
75.0000 mL | Freq: Once | INTRAMUSCULAR | Status: AC | PRN
  Administered 2022-10-02: 75 mL via INTRAVENOUS

## 2022-10-02 MED ORDER — SULFAMETHOXAZOLE-TRIMETHOPRIM 800-160 MG PO TABS
1.0000 | ORAL_TABLET | Freq: Once | ORAL | Status: AC
  Administered 2022-10-02: 1 via ORAL
  Filled 2022-10-02: qty 1

## 2022-10-02 NOTE — ED Notes (Signed)
Patient is being discharged from the Urgent Care and sent to the Emergency Department via POV . Per [facility], patient is in need of higher level of care due to Providers assessment . Patient is aware and verbalizes understanding of plan of care.  Vitals:   10/02/22 0845  BP: (!) 149/75  Pulse: 69  Resp: 16  Temp: 99.1 F (37.3 C)  SpO2: 95%

## 2022-10-02 NOTE — ED Provider Notes (Signed)
Eric Salazar    CSN: [id number] Arrival date & time: 10/02/22  7824      History   Chief Complaint Chief Complaint  Patient presents with   Eye Problem    HPI Eric Salazar is a 61 y.o. male.   Patient presents with left-sided eye and facial swelling that started about 3 days ago.  Patient reports that it started randomly and denies any trauma or foreign body to the face or eye.  He reports that he has had a little bit of crustiness and purulent drainage from the left eye.  Denies any vision changes.  He does not wear contacts.  Denies pain with extraocular movements.  Has taken Benadryl for symptoms with no improvement.  Denies any changes in environment including lotions, soaps, detergents, foods, etc.  Reports that he has been having some "flulike symptoms" and has "felt feverish" at home.  Denies feelings of throat closing or shortness of breath.   Eye Problem   Past Medical History:  Diagnosis Date   [place] coronary artery calcium score less than 100    a. 06/2017 Cardiac CT: Cor Ca2+ = 11-- 54th%'ile.   Cellulitis and abscess of left leg 09/24/2018   History of tobacco abuse    a.Quit 2016.   Morbid obesity (HCC)    Paroxysmal atrial fibrillation (HCC)    a. 12/2015 Echo: EF 60-65%, no rwma; b. Converted on oral BB; c. CHA2DS2VASc = 0-->on ASA 81.   Precordial chest pain    a. 01/2011 Dobutamine Echo: No rwma->nl; b. 12/2015 Recurrent c/p in setting of AF RVR-->MV: mid ant/apical ant fixed defect, likely artifact. No ischemia. EF 55-65%.   Prediabetes     Patient Active Problem List   Diagnosis Date Noted   Mild hyperlipidemia 08/07/2019   PAD (peripheral artery disease) (HCC) 08/07/2019   Low back pain radiating to left lower extremity 01/24/2018   Mild sleep apnea 07/22/2016   Marijuana use 01/07/2016   Chest pain    Paroxysmal atrial fibrillation (HCC) 12/23/2015   SOB (shortness of breath)    Essential hypertension 11/07/2014   Prediabetes     Tobacco use 06/06/2011   Morbid obesity with BMI of 40.0-44.9, adult (HCC) 06/06/2011   Healthcare maintenance 06/06/2011    Past Surgical History:  Procedure Laterality Date   CARDIOVASCULAR STRESS TEST  12/06/2015   low risk study, EF 55-65%   DOBUTAMINE STRESS ECHO  01/05/2011   for typical/atypical chest pain - good tolerance, 11 METS, no significant abnormalities   MRI lumbar  12/05/2006   disc protrusion L4/5 with impingement L5 and probably L4 nerve roots   MULTIPLE TOOTH EXTRACTIONS         Home Medications    Prior to Admission medications   Medication Sig Start Date End Date Taking? Authorizing Provider  aspirin EC 81 MG tablet Take 81 mg by mouth daily.    [provider]  metoprolol tartrate (LOPRESSOR) 50 MG tablet TAKE 1 TABLET BY MOUTH TWICE  DAILY 08/18/22   Eustaquio Boyden, MD  Multiple Vitamin (MULTIVITAMIN WITH MINERALS) TABS tablet Take 1 tablet by mouth daily.    [provider]  Potassium 99 MG TABS Take 2 tablets by mouth daily.    [provider]  vitamin C (ASCORBIC ACID) 500 MG tablet Take 500 mg by mouth daily. As needed    [provider]    Family History Family History  Problem Relation Age of Onset   Hypertension Mother  Diabetes Mother        prediabetes   Coronary artery disease Mother 69       MI   Heart attack Mother    Hypertension Father    Healthy Sister    Cancer Neg Hx     Social History Social History   Tobacco Use   Smoking status: Every Day    Types: Cigarettes   Smokeless tobacco: Never   Tobacco comments:    2-3 cigarettes per day  Vaping Use   Vaping Use: Never used  Substance Use Topics   Alcohol use: Yes    Alcohol/week: 3.0 standard drinks of alcohol    Types: 3 Shots of liquor per week    Comment: occasionally   Drug use: No     Allergies   Patient has no known allergies.   Review of Systems Review of Systems Per HPI  Physical Exam Triage Vital Signs ED  Triage Vitals [10/02/22 0845]  Enc Vitals Group     BP (!) 149/75     Pulse Rate 69     Resp 16     Temp 99.1 F (37.3 C)     Temp src      SpO2 95 %     Weight      Height      Head Circumference      Peak Flow      Pain Score 0     Pain Loc      Pain Edu?      Excl. in GC?    No data found.  Updated Vital Signs BP (!) 149/75   Pulse 69   Temp 99.1 F (37.3 C)   Resp 16   SpO2 95%   Visual Acuity Right Eye Distance:   Left Eye Distance:   Bilateral Distance:    Right Eye Near:   Left Eye Near:    Bilateral Near:     Physical Exam Constitutional:      General: He is not in acute distress.    Appearance: Normal appearance. He is not toxic-appearing or diaphoretic.  HENT:     Head: Normocephalic and atraumatic.      Comments: Has significant swelling to left upper and lower eyelid that extends down left cheek of face.  Area is also erythematous especially left cheek of face.  It is also very warm to the touch.  Redness extends above left eye and around hairline across the forehead. No obvious drainage noted from the eye at this time.  No pain with extraocular movements. No obvious abnormalities to the eyeball. Eyes:     Extraocular Movements: Extraocular movements intact.     Conjunctiva/sclera: Conjunctivae normal.  Pulmonary:     Effort: Pulmonary effort is normal.  Neurological:     General: No focal deficit present.     Mental Status: He is alert and oriented to person, place, and time. Mental status is at baseline.  Psychiatric:        Mood and Affect: Mood normal.        Behavior: Behavior normal.        Thought Content: Thought content normal.        Judgment: Judgment normal.      UC Treatments / Results  Labs (all labs ordered are listed, but only abnormal results are displayed) Labs Reviewed - No data to display  EKG   Radiology No results found.  Procedures Procedures (including critical care time)  Medications Ordered in  UC Medications - No data to display  Initial Impression / Assessment and Plan / UC Course  I have reviewed the triage vital signs and the nursing notes.  Pertinent labs & imaging results that were available during my care of the patient were reviewed by me and considered in my medical decision making (see chart for details).     I am very concerned the patient could have some form of cellulitis of the face/eye.  Area is very warm to the touch and patient has significant erythema and swelling.  I do think patient needs further evaluation and management at the hospital with possible imaging of the face/head.  Patient's airway is stable and no shortness of breath.  Given limited resources here in urgent care, patient was advised to go to the ER for further evaluation and management.  Patient was agreeable with plan.  Vital signs and patient stable at discharge.  Agree with patient self transport to the hospital. Final Clinical Impressions(s) / UC Diagnoses   Final diagnoses:  Left facial swelling  Swelling of left eyelid     Discharge Instructions      Please go to the emergency department as soon as you leave urgent care for further evaluation and management.    ED Prescriptions   None    PDMP not reviewed this encounter.   Teodora Medici, [place] 10/02/22 [phone]

## 2022-10-02 NOTE — ED Provider Notes (Signed)
[facility] Provider Note    Event Date/Time   First MD Initiated Contact with Patient 10/02/22 1210     (approximate)   History   Chief Complaint Eye Problem, Eye Drainage, and Facial Swelling   HPI  Eric Salazar is a 61 y.o. male with past medical history of hypertension, paroxysmal atrial fibrillation, and PAD who presents to the ED complaining of facial swelling.  Patient reports that over the past 3 days he has been dealing with increasing swelling and tenderness over the left side of his face.  He states that this side feels hot to touch and has appeared red, with the swelling surrounding his left eye and extending backwards to his left ear.  He has noticed some swelling and tenderness along the ear itself.  He denies any pain in the area of his globe and does not have any pain with eye movements.  He states he has felt hot at times but has not had any fevers when checked.  He was initially evaluated at urgent care and referred to the ED for further evaluation.  He does not wear contact lenses.     Physical Exam   Triage Vital Signs: ED Triage Vitals  Enc Vitals Group     BP 10/02/22 1146 137/73     Pulse Rate 10/02/22 1146 69     Resp 10/02/22 1146 20     Temp 10/02/22 1146 99.5 F (37.5 C)     Temp Source 10/02/22 1146 Oral     SpO2 10/02/22 1146 96 %     Weight 10/02/22 0915 291 lb 0.1 oz (132 kg)     Height 10/02/22 0915 5\' 10"  (1.778 m)     Head Circumference --      Peak Flow --      Pain Score 10/02/22 0915 0     Pain Loc --      Pain Edu? --      Excl. in [place]? --     Most recent vital signs: Vitals:   10/02/22 1146  BP: 137/73  Pulse: 69  Resp: 20  Temp: 99.5 F (37.5 C)  SpO2: 96%    Constitutional: Alert and oriented. Eyes: Conjunctivae are normal. Pupils equal, round, and reactive to light bilaterally.  Extraocular movements intact without pain. Head: Edema, erythema, and warmth extending over left face involving  maxillary area, periorbital area, and left ear.  No focal fluctuance noted. Nose: No congestion/rhinnorhea. Mouth/Throat: Mucous membranes are moist.  Cardiovascular: Normal rate, regular rhythm. Grossly normal heart sounds.  2+ radial pulses bilaterally. Respiratory: Normal respiratory effort.  No retractions. Lungs CTAB. Gastrointestinal: Soft and nontender. No distention. Musculoskeletal: No lower extremity tenderness nor edema.  Neurologic:  Normal speech and language. No gross focal neurologic deficits are appreciated.    ED Results / Procedures / Treatments   Labs (all labs ordered are listed, but only abnormal results are displayed) Labs Reviewed  CBC - Abnormal; Notable for the following components:      Result Value   WBC 11.8 (*)    Platelets 147 (*)    All other components within normal limits  COMPREHENSIVE METABOLIC PANEL - Abnormal; Notable for the following components:   Glucose, Bld 127 (*)    Calcium 8.7 (*)    AST 66 (*)    ALT 86 (*)    All other components within normal limits   RADIOLOGY CT maxillofacial reviewed and interpreted by me with edema and inflammatory  changes noted periorbitally with no postseptal inflammation.  PROCEDURES:  Critical Care performed: No  Procedures   MEDICATIONS ORDERED IN ED: Medications  iohexol (OMNIPAQUE) 300 MG/ML solution 75 mL (75 mLs Intravenous Contrast Given 10/02/22 1017)  sulfamethoxazole-trimethoprim (BACTRIM DS) 800-160 MG per tablet 1 tablet (1 tablet Oral Given 10/02/22 1233)  amoxicillin (AMOXIL) capsule 1,000 mg (1,000 mg Oral Given 10/02/22 1233)     IMPRESSION / MDM / ASSESSMENT AND PLAN / ED COURSE  I reviewed the triage vital signs and the nursing notes.                              61 y.o. male with past medical history of hypertension, PAD, and paroxysmal atrial fibrillation who presents to the ED complaining of pain and swelling developing around his left eye and tracking back to his left ear  over the past 3 days.  Patient's presentation is most consistent with acute presentation with potential threat to life or bodily function.  Differential diagnosis includes, but is not limited to, orbital cellulitis, periorbital cellulitis, facial abscess, otitis externa, sepsis.  Patient well-appearing and in no acute distress, vital signs are unremarkable and do not appear concerning for sepsis.  Labs show mild leukocytosis but are otherwise reassuring with no significant anemia, electrolyte abnormality, or AKI.  CT scan performed and consistent with periorbital cellulitis, no evidence of postseptal infection concerning for orbital cellulitis.  Potential admission was discussed with patient and he prefers trial of outpatient management with oral antibiotics.  He was given first dose of Bactrim and amoxicillin here in the ED, will be prescribed a 7-day course which she was counseled to take the entirety of.  He was counseled to follow-up with his PCP and given strict return precautions.  Patient agrees with plan.      FINAL CLINICAL IMPRESSION(S) / ED DIAGNOSES   Final diagnoses:  Periorbital cellulitis of left eye     Rx / DC Orders   ED Discharge Orders          Ordered    amoxicillin (AMOXIL) 500 MG capsule  2 times daily        10/02/22 1225    sulfamethoxazole-trimethoprim (BACTRIM DS) 800-160 MG tablet  2 times daily        10/02/22 1225             Note:  This document was prepared using Dragon voice recognition software and may include unintentional dictation errors.   Chesley Noon, MD 10/02/22 1326

## 2022-10-02 NOTE — ED Triage Notes (Signed)
Pt. Presents to [facility]/ c/o left eye swelling for the past 3 days. Pt. Has taken benadryl for treatment.

## 2022-10-02 NOTE — ED Triage Notes (Signed)
Pt reports Thursday started with flu like sx's. Pt reports the flu sx's went away but he continued to have chills and now has swelling around his left eye and redness and drainage. Denies fevers

## 2022-10-02 NOTE — Discharge Instructions (Signed)
Please go to the emergency department as soon as you leave urgent care for further evaluation and management. ?

## 2022-10-05 ENCOUNTER — Ambulatory Visit (INDEPENDENT_AMBULATORY_CARE_PROVIDER_SITE_OTHER): Payer: BC Managed Care – PPO | Admitting: Family Medicine

## 2022-10-05 ENCOUNTER — Encounter: Payer: Self-pay | Admitting: Family Medicine

## 2022-10-05 VITALS — BP 140/68 | HR 67 | Temp 97.5°F | Ht 70.0 in | Wt 287.1 lb

## 2022-10-05 DIAGNOSIS — R7401 Elevation of levels of liver transaminase levels: Secondary | ICD-10-CM | POA: Diagnosis not present

## 2022-10-05 DIAGNOSIS — L03213 Periorbital cellulitis: Secondary | ICD-10-CM | POA: Insufficient documentation

## 2022-10-05 NOTE — Progress Notes (Signed)
Patient ID: Eric Salazar, male    DOB: 03-16-1961, 61 y.o.   MRN: [id number]  This visit was conducted in person.  BP (!) 140/68   Pulse 67   Temp (!) 97.5 F (36.4 C) (Temporal)   Ht 5\' 10"  (1.778 m)   Wt 287 lb 2 oz (130.2 kg)   SpO2 97%   BMI 41.20 kg/m   Vision Screening   Right eye Left eye Both eyes  Without correction 20/20 20/20 20/20   With correction        CC: ER f/u visit  Subjective:   HPI: Eric Salazar is a 61 y.o. male presenting on 10/05/2022 for Hospitalization Follow-up (Seen on 10/02/22 at [facility] ED, dx periorbital cellulitis of L eye. )   Recent ER evaluation 10/02/2022 diagnosed with left facial and preseptal cellulitis by CT scan, treated with amoxicillin 1000mg  bid and bactrim one tablet BID x7 day course.   Also had mild transaminitis noted.   Had flu like symptoms (fever, body aches) for a few days prior to eye swelling. Subsequently developed skin rash to left side of face followed by left facial swelling.   Since home, no fevers/chills, vision changes, painful eye movement, or ST.   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      Relevant past medical, surgical, family and social history reviewed and updated as indicated. Interim medical history since our last visit reviewed. Allergies and medications reviewed and updated. Outpatient Medications Prior to Visit  Medication Sig Dispense Refill   amoxicillin (AMOXIL) 500 MG capsule Take 2 capsules (1,000 mg total) by mouth 2 (two) times daily for 7 days. 28 capsule 0   aspirin EC 81 MG tablet Take 81 mg by mouth daily.     metoprolol tartrate (LOPRESSOR) 50 MG tablet TAKE 1 TABLET BY MOUTH TWICE  DAILY 180 tablet 3   Multiple Vitamin (MULTIVITAMIN WITH MINERALS) TABS tablet Take 1 tablet by mouth daily.     Potassium 99 MG TABS Take 2 tablets by mouth daily.     sulfamethoxazole-trimethoprim (BACTRIM DS) 800-160 MG tablet Take 1 tablet by mouth 2 (two) times daily for 7  days. 14 tablet 0   vitamin C (ASCORBIC ACID) 500 MG tablet Take 500 mg by mouth daily. As needed     No facility-administered medications prior to visit.     Per HPI unless specifically indicated in ROS section below Review of Systems  Objective:  BP (!) 140/68   Pulse 67   Temp (!) 97.5 F (36.4 C) (Temporal)   Ht 5\' 10"  (1.778 m)   Wt 287 lb 2 oz (130.2 kg)   SpO2 97%   BMI 41.20 kg/m   Wt Readings from Last 3 Encounters:  10/05/22 287 lb 2 oz (130.2 kg)  10/02/22 291 lb 0.1 oz (132 kg)  08/02/22 291 lb 8 oz (132.2 kg)      Physical Exam Vitals and nursing note reviewed.  Constitutional:      Appearance: Normal appearance. He is not ill-appearing.  HENT:     Right Ear: Tympanic membrane and ear canal normal.     Left Ear: Tympanic membrane normal.     Ears:     Comments: Scaling of skin of lower ear lobe and posterior to left ear without erythema    Mouth/Throat:     Pharynx: Oropharynx is clear. No oropharyngeal exudate or posterior oropharyngeal erythema.  Eyes:     Extraocular Movements: Extraocular movements  intact.     Conjunctiva/sclera: Conjunctivae normal.     Pupils: Pupils are equal, round, and reactive to light.      Comments:  Mild periorbital erythema and edema, erythema extends just above eyebrow  EOMI without pain No significant conjunctival injection, no drainage  Cardiovascular:     Rate and Rhythm: Normal rate and regular rhythm.     Pulses: Normal pulses.     Heart sounds: Normal heart sounds. No murmur heard. Pulmonary:     Effort: Pulmonary effort is normal. No respiratory distress.     Breath sounds: Normal breath sounds. No wheezing, rhonchi or rales.  Musculoskeletal:     Right lower leg: No edema.     Left lower leg: No edema.  Skin:    General: Skin is warm and dry.     Findings: No rash.  Neurological:     Mental Status: He is alert.  Psychiatric:        Mood and Affect: Mood normal.        Behavior: Behavior normal.        Results for orders placed or performed during the hospital encounter of 10/02/22  CBC  Result Value Ref Range   WBC 11.8 (H) 4.0 - 10.5 K/uL   RBC 4.69 4.22 - 5.81 MIL/uL   Hemoglobin 14.5 13.0 - 17.0 g/dL   HCT 83.1 51.7 - 61.6 %   MCV 90.2 80.0 - 100.0 fL   MCH 30.9 26.0 - 34.0 pg   MCHC 34.3 30.0 - 36.0 g/dL   RDW 07.3 71.0 - 62.6 %   Platelets 147 (L) 150 - 400 K/uL   nRBC 0.0 0.0 - 0.2 %  Comprehensive metabolic panel  Result Value Ref Range   Sodium 136 135 - 145 mmol/L   Potassium 3.8 3.5 - 5.1 mmol/L   Chloride 102 98 - 111 mmol/L   CO2 24 22 - 32 mmol/L   Glucose, Bld 127 (H) 70 - 99 mg/dL   BUN 16 6 - 20 mg/dL   Creatinine, Ser 9.48 0.61 - 1.24 mg/dL   Calcium 8.7 (L) 8.9 - 10.3 mg/dL   Total Protein 7.6 6.5 - 8.1 g/dL   Albumin 3.7 3.5 - 5.0 g/dL   AST 66 (H) 15 - 41 U/L   ALT 86 (H) 0 - 44 U/L   Alkaline Phosphatase 80 38 - 126 U/L   Total Bilirubin 0.7 0.3 - 1.2 mg/dL   GFR, Estimated >54 >62 mL/min   Anion gap 10 5 - 15   CT MAXILLOFACIAL W CONTRAST CLINICAL DATA:  61 year old male with left eye swelling for 3 days.  EXAM: CT MAXILLOFACIAL WITH CONTRAST  TECHNIQUE: Multidetector CT imaging of the maxillofacial structures was performed with intravenous contrast. Multiplanar CT image reconstructions were also generated.  RADIATION DOSE REDUCTION: This exam was performed according to the departmental dose-optimization program which includes automated exposure control, adjustment of the mA and/or kV according to patient size and/or use of iterative reconstruction technique.  CONTRAST:  68mL OMNIPAQUE IOHEXOL 300 MG/ML  SOLN  COMPARISON:  None Available.  FINDINGS: Osseous: Mandible intact and normally located. Some chronic dental inflammatory changes but no definite acute dental finding. Maxilla, pterygoid, zygoma, and nasal bones appear intact. Central skull base and visible cervical vertebrae appear intact.  Orbits: Intact orbital walls.  Broad-based area of left periorbital preseptal soft tissue swelling up to 9 mm, tracking into the lateral left face and pre-auricular soft tissues. Associated subcutaneous stranding and swelling. The  left globe remains normal. The left orbit postseptal soft tissues appears symmetric and normal. Contralateral right orbits soft tissues are within normal limits.  Sinuses: Minimal paranasal sinus mucosal thickening. Tympanic cavities, mastoids, petrous apex and sphenoid wing air cells are clear. No fluid levels.  Soft tissues: Left face and orbit soft tissue abnormality described above. Visible larynx, pharynx, parapharyngeal spaces, retropharyngeal space, sublingual space, submandibular spaces, right parotid and masticator spaces appear normal. There is mild secondary inflammation of the left parotid space. No parotid duct enlargement. No sialolithiasis. Left masticator space relatively spared.  Major vascular structures in the neck and at the skull base appear to be patent including the cavernous sinuses. There is some calcified atherosclerosis.  Limited intracranial: Negative.  IMPRESSION: Left face and orbit preseptal Cellulitis. No postseptal involvement, abscess, or other complicating features.  Electronically Signed   By: Genevie Ann M.D.   On: 10/02/2022 10:54  Assessment & Plan:   Problem List Items Addressed This Visit     Preseptal cellulitis of left eye - Primary    Pt marked improvement over the last several days. Supportive home measures reviewed. rec finish full antibiotic course.  Update if ongoing symptoms past treatment.  Vision screen normal today. 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       Transaminitis    Mild, incidentally noted. Pt asxs. Check LFTs next labwork.         No orders of the defined types were placed in this encounter.  No orders of the defined types were placed in this encounter.    Patient Instructions  I'm glad you're doing better.   Finish antibiotic.  Let us know if not fully resolved after treatment.   Follow up plan: Return if symptoms worsen or fail to improve.  Ria Bush, MD

## 2022-10-05 NOTE — Assessment & Plan Note (Signed)
Pt marked improvement over the last several days. Supportive home measures reviewed. rec finish full antibiotic course.  Update if ongoing symptoms past treatment.  Vision screen normal today. 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

## 2022-10-05 NOTE — Assessment & Plan Note (Signed)
Mild, incidentally noted. Pt asxs. Check LFTs next labwork.

## 2022-10-05 NOTE — Patient Instructions (Signed)
I'm glad you're doing better.  Finish antibiotic.  Let us know if not fully resolved after treatment.

## 2022-12-13 ENCOUNTER — Encounter: Payer: Self-pay | Admitting: Family Medicine

## 2023-01-04 ENCOUNTER — Encounter: Payer: Self-pay | Admitting: Family Medicine

## 2023-01-04 NOTE — Telephone Encounter (Signed)
Updated pt's pharmacy list.

## 2023-01-09 ENCOUNTER — Ambulatory Visit: Payer: BC Managed Care – PPO | Admitting: Family Medicine

## 2023-01-16 ENCOUNTER — Ambulatory Visit (INDEPENDENT_AMBULATORY_CARE_PROVIDER_SITE_OTHER): Payer: Self-pay | Admitting: Family Medicine

## 2023-01-16 ENCOUNTER — Encounter: Payer: Self-pay | Admitting: Family Medicine

## 2023-01-16 VITALS — BP 136/86 | HR 72 | Temp 97.5°F | Ht 70.0 in | Wt 299.4 lb

## 2023-01-16 DIAGNOSIS — I10 Essential (primary) hypertension: Secondary | ICD-10-CM

## 2023-01-16 DIAGNOSIS — R7401 Elevation of levels of liver transaminase levels: Secondary | ICD-10-CM

## 2023-01-16 LAB — COMPREHENSIVE METABOLIC PANEL
ALT: 26 U/L (ref 0–53)
AST: 23 U/L (ref 0–37)
Albumin: 3.7 g/dL (ref 3.5–5.2)
Alkaline Phosphatase: 133 U/L — ABNORMAL HIGH (ref 39–117)
BUN: 23 mg/dL (ref 6–23)
CO2: 31 mEq/L (ref 19–32)
Calcium: 9.1 mg/dL (ref 8.4–10.5)
Chloride: 101 mEq/L (ref 96–112)
Creatinine, Ser: 0.99 mg/dL (ref 0.40–1.50)
GFR: 82.37 mL/min (ref >60.00)
Glucose, Bld: 100 mg/dL — ABNORMAL HIGH (ref 70–99)
Potassium: 4.4 mEq/L (ref 3.5–5.1)
Sodium: 140 mEq/L (ref 135–145)
Total Bilirubin: 0.4 mg/dL (ref 0.2–1.2)
Total Protein: 6.3 g/dL (ref 6.0–8.3)

## 2023-01-16 LAB — TSH: TSH: 0.4 u[IU]/mL (ref 0.35–5.50)

## 2023-01-16 MED ORDER — AMLODIPINE BESYLATE 2.5 MG PO TABS: 2.5000 mg | ORAL_TABLET | Freq: Every day | ORAL | 3 refills | Status: DC

## 2023-01-16 NOTE — Assessment & Plan Note (Signed)
Update LFTs today.

## 2023-01-16 NOTE — Patient Instructions (Signed)
BP overall looking ok Labs today Continue metoprolol 61m twice daily, add low dose amlodipine 2.520mdaily to regimen. Let usKoreanow if you develop any low blood pressures symptoms like dizziness or lightheadedness.

## 2023-01-16 NOTE — Assessment & Plan Note (Signed)
Chronic, BP overall stable based on home am readings - I wonder if morning BP well controlled however elevation comes on later in the day - will add low dose amlodipine 2.80m daily in addition to metoprolol 544mBID. Advised watching for ankle swelling on this med. Continue to monitor BP at home, update usKoreaf any trouble with new medication.  Check CMP, TSH today.

## 2023-01-16 NOTE — Progress Notes (Signed)
Patient ID: Eric Salazar, male    DOB: 1961-06-04, 62 y.o.   MRN: [id number]  This visit was conducted in person.  BP 136/86 (BP Location: Right Arm, Cuff Size: Large)   Pulse 72   Temp (!) 97.5 F (36.4 C) (Temporal)   Ht 5' 10"$  (1.778 m)   Wt 299 lb 6 oz (135.8 kg)   SpO2 97%   BMI 42.96 kg/m    CC: HTN f/u visit  Subjective:   HPI: Eric Salazar is a 62 y.o. male presenting on 01/16/2023 for Medical Management of Chronic Issues (Here for HTN f/u. Reports elevated BP at [employer] PE. Told to see PCP. Pt brought in home BP monitor to compare. Reading in office today- 148/88.)   Recent [employer] physical, found to have elevated BP readings [id number], given 3 mo certification and rec PCP f/u.  Home cuff reading in office comparable to our readings.   HTN - Compliant with current antihypertensive regimen of metoprolol 84m BID. Does check blood pressures at home and brings log - 114-139/70-80s, highest to 142/105 improved on recheck to 121/73. No low blood pressure readings or symptoms of dizziness/syncope. Denies HA, vision changes, CP/tightness, SOB, leg swelling.      Relevant past medical, surgical, family and social history reviewed and updated as indicated. Interim medical history since our last visit reviewed. Allergies and medications reviewed and updated. Outpatient Medications Prior to Visit  Medication Sig Dispense Refill   aspirin EC 81 MG tablet Take 81 mg by mouth daily.     metoprolol tartrate (LOPRESSOR) 50 MG tablet TAKE 1 TABLET BY MOUTH TWICE  DAILY 180 tablet 3   Multiple Vitamin (MULTIVITAMIN WITH MINERALS) TABS tablet Take 1 tablet by mouth daily.     Potassium 99 MG TABS Take 2 tablets by mouth daily.     vitamin C (ASCORBIC ACID) 500 MG tablet Take 500 mg by mouth daily. As needed     No facility-administered medications prior to visit.     Per HPI unless specifically indicated in ROS section below Review of Systems  Objective:  BP 136/86 (BP Location:  Right Arm, Cuff Size: Large)   Pulse 72   Temp (!) 97.5 F (36.4 C) (Temporal)   Ht 5' 10"$  (1.778 m)   Wt 299 lb 6 oz (135.8 kg)   SpO2 97%   BMI 42.96 kg/m   Wt Readings from Last 3 Encounters:  01/16/23 299 lb 6 oz (135.8 kg)  10/05/22 287 lb 2 oz (130.2 kg)  10/02/22 291 lb 0.1 oz (132 kg)      Physical Exam Vitals and nursing note reviewed.  Constitutional:      Appearance: Normal appearance. He is not ill-appearing.  Cardiovascular:     Rate and Rhythm: Normal rate and regular rhythm.     Pulses: Normal pulses.     Heart sounds: Normal heart sounds. No murmur heard. Pulmonary:     Effort: Pulmonary effort is normal. No respiratory distress.     Breath sounds: Normal breath sounds. No wheezing, rhonchi or rales.  Musculoskeletal:     Right lower leg: No edema.     Left lower leg: No edema.  Skin:    General: Skin is warm and dry.     Findings: No rash.  Neurological:     Mental Status: He is alert.  Psychiatric:        Mood and Affect: Mood normal.        Behavior: Behavior  normal.       Results for orders placed or performed during the hospital encounter of 10/02/22  CBC  Result Value Ref Range   WBC 11.8 (H) 4.0 - 10.5 K/uL   RBC 4.69 4.22 - 5.81 MIL/uL   Hemoglobin 14.5 13.0 - 17.0 g/dL   HCT 42.3 39.0 - 52.0 %   MCV 90.2 80.0 - 100.0 fL   MCH 30.9 26.0 - 34.0 pg   MCHC 34.3 30.0 - 36.0 g/dL   RDW 13.3 11.5 - 15.5 %   Platelets 147 (L) 150 - 400 K/uL   nRBC 0.0 0.0 - 0.2 %  Comprehensive metabolic panel  Result Value Ref Range   Sodium 136 135 - 145 mmol/L   Potassium 3.8 3.5 - 5.1 mmol/L   Chloride 102 98 - 111 mmol/L   CO2 24 22 - 32 mmol/L   Glucose, Bld 127 (H) 70 - 99 mg/dL   BUN 16 6 - 20 mg/dL   Creatinine, Ser 1.00 0.61 - 1.24 mg/dL   Calcium 8.7 (L) 8.9 - 10.3 mg/dL   Total Protein 7.6 6.5 - 8.1 g/dL   Albumin 3.7 3.5 - 5.0 g/dL   AST 66 (H) 15 - 41 U/L   ALT 86 (H) 0 - 44 U/L   Alkaline Phosphatase 80 38 - 126 U/L   Total Bilirubin  0.7 0.3 - 1.2 mg/dL   GFR, Estimated >60 >60 mL/min   Anion gap 10 5 - 15   Lab Results  Component Value Date   TSH 0.71 05/23/2018    Assessment & Plan:   Problem List Items Addressed This Visit     Essential hypertension - Primary    Chronic, BP overall stable based on home am readings - I wonder if morning BP well controlled however elevation comes on later in the day - will add low dose amlodipine 2.50m daily in addition to metoprolol 592mBID. Advised watching for ankle swelling on this med. Continue to monitor BP at home, update usKoreaf any trouble with new medication.  Check CMP, TSH today.       Relevant Medications   amLODipine (NORVASC) 2.5 MG tablet   Other Relevant Orders   Comprehensive metabolic panel   TSH   Transaminitis    Update LFTs today.         Meds ordered this encounter  Medications   amLODipine (NORVASC) 2.5 MG tablet    Sig: Take 1 tablet (2.5 mg total) by mouth daily.    Dispense:  90 tablet    Refill:  3    Orders Placed This Encounter  Procedures   Comprehensive metabolic panel   TSH    Patient Instructions  BP overall looking ok Labs today Continue metoprolol 5043mwice daily, add low dose amlodipine 2.5mg61mily to regimen. Let us kKoreaw if you develop any low blood pressures symptoms like dizziness or lightheadedness.   Follow up plan: Return if symptoms worsen or fail to improve.  JaviRia Bush

## 2023-07-08 ENCOUNTER — Encounter: Payer: Self-pay | Admitting: Family Medicine

## 2023-07-08 DIAGNOSIS — I10 Essential (primary) hypertension: Secondary | ICD-10-CM

## 2023-07-10 MED ORDER — AMLODIPINE BESYLATE 2.5 MG PO TABS: 2.5000 mg | ORAL_TABLET | Freq: Every day | ORAL | 0 refills | Status: DC

## 2023-07-10 NOTE — Telephone Encounter (Signed)
Updated pt's pharmacy list. E-scribed requested refill.

## 2023-07-28 ENCOUNTER — Other Ambulatory Visit: Payer: BC Managed Care – PPO

## 2023-08-01 ENCOUNTER — Encounter: Payer: Self-pay | Admitting: *Deleted

## 2023-08-04 ENCOUNTER — Encounter: Payer: BC Managed Care – PPO | Admitting: Family Medicine

## 2023-10-02 ENCOUNTER — Other Ambulatory Visit: Payer: Self-pay | Admitting: Family Medicine

## 2023-10-02 DIAGNOSIS — I10 Essential (primary) hypertension: Secondary | ICD-10-CM

## 2023-10-08 ENCOUNTER — Other Ambulatory Visit: Payer: Self-pay | Admitting: Family Medicine

## 2023-10-08 DIAGNOSIS — Z125 Encounter for screening for malignant neoplasm of prostate: Secondary | ICD-10-CM

## 2023-10-08 DIAGNOSIS — I10 Essential (primary) hypertension: Secondary | ICD-10-CM

## 2023-10-08 DIAGNOSIS — E785 Hyperlipidemia, unspecified: Secondary | ICD-10-CM

## 2023-10-08 DIAGNOSIS — R7303 Prediabetes: Secondary | ICD-10-CM

## 2023-10-08 DIAGNOSIS — I480 Paroxysmal atrial fibrillation: Secondary | ICD-10-CM

## 2023-10-09 ENCOUNTER — Other Ambulatory Visit (INDEPENDENT_AMBULATORY_CARE_PROVIDER_SITE_OTHER): Payer: Managed Care, Other (non HMO)

## 2023-10-09 DIAGNOSIS — R7303 Prediabetes: Secondary | ICD-10-CM

## 2023-10-09 DIAGNOSIS — I10 Essential (primary) hypertension: Secondary | ICD-10-CM

## 2023-10-09 DIAGNOSIS — I480 Paroxysmal atrial fibrillation: Secondary | ICD-10-CM | POA: Diagnosis not present

## 2023-10-09 DIAGNOSIS — E785 Hyperlipidemia, unspecified: Secondary | ICD-10-CM | POA: Diagnosis not present

## 2023-10-09 DIAGNOSIS — Z125 Encounter for screening for malignant neoplasm of prostate: Secondary | ICD-10-CM | POA: Diagnosis not present

## 2023-10-09 LAB — PSA: PSA: 3.71 ng/mL (ref 0.10–4.00)

## 2023-10-09 LAB — CBC WITH DIFFERENTIAL/PLATELET
Basophils Absolute: 0 10*3/uL (ref 0.0–0.1)
Basophils Relative: 0.8 % (ref 0.0–3.0)
Eosinophils Absolute: 0.2 10*3/uL (ref 0.0–0.7)
Eosinophils Relative: 3.4 % (ref 0.0–5.0)
HCT: 44.5 % (ref 39.0–52.0)
Hemoglobin: 14.5 g/dL (ref 13.0–17.0)
Lymphocytes Relative: 27.8 % (ref 12.0–46.0)
Lymphs Abs: 1.5 10*3/uL (ref 0.7–4.0)
MCHC: 32.6 g/dL (ref 30.0–36.0)
MCV: 96 fL (ref 78.0–100.0)
Monocytes Absolute: 0.6 10*3/uL (ref 0.1–1.0)
Monocytes Relative: 10.8 % (ref 3.0–12.0)
Neutro Abs: 3.2 10*3/uL (ref 1.4–7.7)
Neutrophils Relative %: 57.2 % (ref 43.0–77.0)
Platelets: 196 10*3/uL (ref 150.0–400.0)
RBC: 4.63 Mil/uL (ref 4.22–5.81)
RDW: 13.6 % (ref 11.5–15.5)
WBC: 5.6 10*3/uL (ref 4.0–10.5)

## 2023-10-09 LAB — LIPID PANEL
Cholesterol: 192 mg/dL (ref 0–200)
HDL: 58.8 mg/dL (ref >39.00)
LDL Cholesterol: 111 mg/dL — ABNORMAL HIGH (ref 0–99)
NonHDL: 133.45
Total CHOL/HDL Ratio: 3
Triglycerides: 111 mg/dL (ref 0.0–149.0)
VLDL: 22.2 mg/dL (ref 0.0–40.0)

## 2023-10-09 LAB — COMPREHENSIVE METABOLIC PANEL
ALT: 32 U/L (ref 0–53)
AST: 29 U/L (ref 0–37)
Albumin: 3.9 g/dL (ref 3.5–5.2)
Alkaline Phosphatase: 100 U/L (ref 39–117)
BUN: 19 mg/dL (ref 6–23)
CO2: 33 meq/L — ABNORMAL HIGH (ref 19–32)
Calcium: 9.3 mg/dL (ref 8.4–10.5)
Chloride: 101 meq/L (ref 96–112)
Creatinine, Ser: 1.02 mg/dL (ref 0.40–1.50)
GFR: 79.07 mL/min (ref >60.00)
Glucose, Bld: 101 mg/dL — ABNORMAL HIGH (ref 70–99)
Potassium: 4.3 meq/L (ref 3.5–5.1)
Sodium: 140 meq/L (ref 135–145)
Total Bilirubin: 1 mg/dL (ref 0.2–1.2)
Total Protein: 7 g/dL (ref 6.0–8.3)

## 2023-10-09 LAB — HEMOGLOBIN A1C: Hgb A1c MFr Bld: 5.7 % (ref 4.6–6.5)

## 2023-10-09 NOTE — Addendum Note (Signed)
Addended by: Alvina Chou on: 10/09/2023 02:33 PM   Modules accepted: Orders

## 2023-10-11 ENCOUNTER — Other Ambulatory Visit (INDEPENDENT_AMBULATORY_CARE_PROVIDER_SITE_OTHER): Payer: Self-pay

## 2023-10-11 DIAGNOSIS — R7303 Prediabetes: Secondary | ICD-10-CM

## 2023-10-11 DIAGNOSIS — I10 Essential (primary) hypertension: Secondary | ICD-10-CM

## 2023-10-11 LAB — MICROALBUMIN / CREATININE URINE RATIO
Creatinine,U: 171 mg/dL
Microalb Creat Ratio: 0.7 mg/g (ref 0.0–30.0)
Microalb, Ur: 1.2 mg/dL (ref 0.0–1.9)

## 2023-10-23 ENCOUNTER — Encounter: Payer: Self-pay | Admitting: Family Medicine

## 2023-10-23 ENCOUNTER — Ambulatory Visit: Payer: Managed Care, Other (non HMO) | Admitting: Family Medicine

## 2023-10-23 VITALS — BP 134/78 | HR 70 | Temp 97.6°F | Ht 69.5 in | Wt 301.5 lb

## 2023-10-23 DIAGNOSIS — I480 Paroxysmal atrial fibrillation: Secondary | ICD-10-CM

## 2023-10-23 DIAGNOSIS — I10 Essential (primary) hypertension: Secondary | ICD-10-CM

## 2023-10-23 DIAGNOSIS — Z0000 Encounter for general adult medical examination without abnormal findings: Secondary | ICD-10-CM

## 2023-10-23 DIAGNOSIS — Z1211 Encounter for screening for malignant neoplasm of colon: Secondary | ICD-10-CM

## 2023-10-23 DIAGNOSIS — M79604 Pain in right leg: Secondary | ICD-10-CM

## 2023-10-23 DIAGNOSIS — Z720 Tobacco use: Secondary | ICD-10-CM | POA: Diagnosis not present

## 2023-10-23 DIAGNOSIS — Z6841 Body Mass Index (BMI) 40.0 and over, adult: Secondary | ICD-10-CM

## 2023-10-23 DIAGNOSIS — E782 Mixed hyperlipidemia: Secondary | ICD-10-CM

## 2023-10-23 DIAGNOSIS — R7303 Prediabetes: Secondary | ICD-10-CM

## 2023-10-23 MED ORDER — AMLODIPINE BESYLATE 2.5 MG PO TABS: 2.5000 mg | ORAL_TABLET | Freq: Every day | ORAL | 4 refills | Status: DC

## 2023-10-23 MED ORDER — METOPROLOL TARTRATE 50 MG PO TABS: 50.0000 mg | ORAL_TABLET | Freq: Two times a day (BID) | ORAL | 4 refills | Status: DC

## 2023-10-23 NOTE — Assessment & Plan Note (Addendum)
Chronic, stable on current regimen - continue. 

## 2023-10-23 NOTE — Assessment & Plan Note (Addendum)
Chronic, reviewed diet choices to improve cholesterol control. Consider starting stating in elevated ASCVD risk . The 10-year ASCVD risk score (Arnett DK, et al., 2019) is: 16.5%   Values used to calculate the score:     Age: 62 years     Sex: Male     Is Non-Hispanic African American: No     Diabetic: No     Tobacco smoker: Yes     Systolic Blood Pressure: 134 mmHg     Is BP treated: Yes     HDL Cholesterol: 58.8 mg/dL     Total Cholesterol: 192 mg/dL

## 2023-10-23 NOTE — Assessment & Plan Note (Signed)
6 months ago noted popping sensation to right leg associated with persistent tightness to that leg with prolonged sitting.  Provided with sciatica exercises from [facility] pt advisor.  Update if not improved with this for further evaluation.

## 2023-10-23 NOTE — Assessment & Plan Note (Addendum)
Saw cardiology last 06/2022, on aspirin, metoprolol.  Sounds regular today.  He is planning to call to schedule cards f/u.

## 2023-10-23 NOTE — Assessment & Plan Note (Addendum)
Encouraged healthy diet and lifestyle choices to affect sustainable weight loss.  ?

## 2023-10-23 NOTE — Assessment & Plan Note (Addendum)
#   provided to call and schedule lung cancer screening program.

## 2023-10-23 NOTE — Assessment & Plan Note (Signed)
Encouraged limiting added sugar in diet.  

## 2023-10-23 NOTE — Progress Notes (Addendum)
Ph: [phone] Fax: [phone]   Patient ID: Eric Salazar, male    DOB: 01-06-1961, 62 y.o.   MRN: [id number]  This visit was conducted in person.  BP 134/78   Pulse 70   Temp 97.6 F (36.4 C) (Oral)   Ht 5' 9.5" (1.765 m)   Wt (!) 301 lb 8 oz (136.8 kg)   SpO2 98%   BMI 43.89 kg/m    CC: CPE Subjective:   HPI: Eric Salazar is a 62 y.o. male presenting on 10/23/2023 for Annual Exam   Gets CDL yearly.   6 months ago while getting into truck felt popping sensation to anterior R thigh. Since then, now when prolonged driving past 1 hour notes pain to posterior R thigh described as tightness sensation. Treating with stretching, ice. No unilateral leg swelling weakness or numbness. No lower back pain. No shooting pain past knee.    Preventative: Colon cancer screening - iFOB neg 12/2015. Requests rpt iFOB Prostate cancer screening - discussed. Yearly PSA. Nocturia x1-2, strong stream.  Lung cancer screening - 40 yr history, about 1/2 ppd. referred last year, never completed. Interested in re-referral.  Flu - declines  COVID vaccine Pfizer 02/2020, 03/2020, booster 11/2020  Tdap 07/2011, Tdap 07/2022 Shingrix - discussed  Seat belt use discussed  Sunscreen use discussed. No changing moles on skin  Sleep - averaging 7-8 hours/night Smoker - 3 cig/day  Alcohol - rare  Dentist - has partial denture, sees regularly Eye exam - due   Caffeine: rare   Lives with wife, son (2002), 3 cats  Occ: Naval architect  Edu: 2 years college Activity: walking 30 min/day in am  Diet: good water, vegetables daily, working on healthy diet changes - sugar free cookies      Relevant past medical, surgical, family and social history reviewed and updated as indicated. Interim medical history since our last visit reviewed. Allergies and medications reviewed and updated. Outpatient Medications Prior to Visit  Medication Sig Dispense Refill   aspirin EC 81 MG tablet Take 81  mg by mouth daily.     Multiple Vitamin (MULTIVITAMIN WITH MINERALS) TABS tablet Take 1 tablet by mouth daily.     amLODipine (NORVASC) 2.5 MG tablet TAKE 1 TABLET(2.5 MG) BY MOUTH DAILY 90 tablet 0   metoprolol tartrate (LOPRESSOR) 50 MG tablet TAKE 1 TABLET BY MOUTH TWICE  DAILY 180 tablet 3   Potassium 99 MG TABS Take 2 tablets by mouth daily. (Patient not taking: Reported on 10/23/2023)     vitamin C (ASCORBIC ACID) 500 MG tablet Take 500 mg by mouth daily. As needed     No facility-administered medications prior to visit.     Per HPI unless specifically indicated in ROS section below Review of Systems  Constitutional:  Negative for activity change, appetite change, chills, fatigue, fever and unexpected weight change.  HENT:  Negative for hearing loss.   Eyes:  Negative for visual disturbance.  Respiratory:  Negative for cough, chest tightness, shortness of breath and wheezing.   Cardiovascular:  Negative for chest pain, palpitations and leg swelling.  Gastrointestinal:  Negative for abdominal distention, abdominal pain, blood in stool, constipation, diarrhea, nausea and vomiting.  Genitourinary:  Negative for difficulty urinating and hematuria.  Musculoskeletal:  Negative for arthralgias, myalgias and neck pain.  Skin:  Negative for rash.  Neurological:  Negative for dizziness, seizures, syncope and headaches.  Hematological:  Negative for adenopathy. Does not bruise/bleed easily.  Psychiatric/Behavioral:  Negative  for dysphoric mood. The patient is not nervous/anxious.     Objective:  BP 134/78   Pulse 70   Temp 97.6 F (36.4 C) (Oral)   Ht 5' 9.5" (1.765 m)   Wt (!) 301 lb 8 oz (136.8 kg)   SpO2 98%   BMI 43.89 kg/m   Wt Readings from Last 3 Encounters:  10/23/23 (!) 301 lb 8 oz (136.8 kg)  01/16/23 299 lb 6 oz (135.8 kg)  10/05/22 287 lb 2 oz (130.2 kg)      Physical Exam Vitals and nursing note reviewed.  Constitutional:      General: He is not in acute  distress.    Appearance: Normal appearance. He is well-developed. He is not ill-appearing.  HENT:     Head: Normocephalic and atraumatic.     Right Ear: Hearing, tympanic membrane, ear canal and external ear normal.     Left Ear: Hearing, tympanic membrane, ear canal and external ear normal.     Mouth/Throat:     Mouth: Mucous membranes are moist.     Pharynx: Oropharynx is clear. No oropharyngeal exudate or posterior oropharyngeal erythema.  Eyes:     General: No scleral icterus.    Extraocular Movements: Extraocular movements intact.     Conjunctiva/sclera: Conjunctivae normal.     Pupils: Pupils are equal, round, and reactive to light.  Neck:     Thyroid: No thyroid mass or thyromegaly.     Vascular: No carotid bruit.  Cardiovascular:     Rate and Rhythm: Normal rate and regular rhythm.     Pulses: Normal pulses.          Radial pulses are 2+ on the right side and 2+ on the left side.     Heart sounds: Normal heart sounds. No murmur heard. Pulmonary:     Effort: Pulmonary effort is normal. No respiratory distress.     Breath sounds: Normal breath sounds. No wheezing, rhonchi or rales.  Abdominal:     General: Bowel sounds are normal. There is no distension.     Palpations: Abdomen is soft. There is no mass.     Tenderness: There is no abdominal tenderness. There is no guarding or rebound.     Hernia: No hernia is present.  Musculoskeletal:        General: Normal range of motion.     Cervical back: Normal range of motion and neck supple.     Right lower leg: No edema.     Left lower leg: No edema.     Comments:  Neg SLR on right  R leg exam:  No pain with int/ext rotation at hip No pain with flexion/extension at knee No pain to palpation of thigh or knee   Lymphadenopathy:     Cervical: No cervical adenopathy.  Skin:    General: Skin is warm and dry.     Findings: No rash.  Neurological:     General: No focal deficit present.     Mental Status: He is alert and  oriented to person, place, and time.  Psychiatric:        Mood and Affect: Mood normal.        Behavior: Behavior normal.        Thought Content: Thought content normal.        Judgment: Judgment normal.       Results for orders placed or performed in visit on 10/11/23  Microalbumin / creatinine urine ratio  Result Value Ref Range  Microalb, Ur 1.2 0.0 - 1.9 mg/dL   Creatinine,U 098.1 mg/dL   Microalb Creat Ratio 0.7 0.0 - 30.0 mg/g    Assessment & Plan:   Problem List Items Addressed This Visit     Healthcare maintenance - Primary (Chronic)    Preventative protocols reviewed and updated unless pt declined. Discussed healthy diet and lifestyle.  Has not completed colon cancer screening since 2017. Declines colonoscopy. Advised to complete iFOB this year.       Tobacco use    # provided to call and schedule lung cancer screening program.       Morbid obesity with BMI of 40.0-44.9, adult (HCC)    Encouraged healthy diet and lifestyle choices to affect sustainable weight loss.       Prediabetes    Encouraged limiting added sugar in diet.       Right leg pain    6 months ago noted popping sensation to right leg associated with persistent tightness to that leg with prolonged sitting.  Provided with sciatica exercises from [facility] pt advisor.  Update if not improved with this for further evaluation.       Essential hypertension    Chronic, stable on current regimen - continue      Relevant Medications   amLODipine (NORVASC) 2.5 MG tablet   metoprolol tartrate (LOPRESSOR) 50 MG tablet   Paroxysmal atrial fibrillation (HCC)    Saw cardiology last 06/2022, on aspirin, metoprolol.  Sounds regular today.  He is planning to call to schedule cards f/u.       Relevant Medications   amLODipine (NORVASC) 2.5 MG tablet   metoprolol tartrate (LOPRESSOR) 50 MG tablet   Hyperlipidemia    Chronic, reviewed diet choices to improve cholesterol control. Consider starting stating in  elevated ASCVD risk . The 10-year ASCVD risk score (Arnett DK, et al., 2019) is: 16.5%   Values used to calculate the score:     Age: 55 years     Sex: Male     Is Non-Hispanic African American: No     Diabetic: No     Tobacco smoker: Yes     Systolic Blood Pressure: 134 mmHg     Is BP treated: Yes     HDL Cholesterol: 58.8 mg/dL     Total Cholesterol: 192 mg/dL       Relevant Medications   amLODipine (NORVASC) 2.5 MG tablet   metoprolol tartrate (LOPRESSOR) 50 MG tablet   Other Visit Diagnoses     Special screening for malignant neoplasms, colon       Relevant Orders   Fecal occult blood, imunochemical        Meds ordered this encounter  Medications   amLODipine (NORVASC) 2.5 MG tablet    Sig: Take 1 tablet (2.5 mg total) by mouth daily.    Dispense:  90 tablet    Refill:  4   metoprolol tartrate (LOPRESSOR) 50 MG tablet    Sig: Take 1 tablet (50 mg total) by mouth 2 (two) times daily.    Dispense:  180 tablet    Refill:  4    Orders Placed This Encounter  Procedures   Fecal occult blood, imunochemical    Standing Status:   Future    Standing Expiration Date:   10/22/2024    Patient Instructions  Pass by lab to pick up stool kit.  Try exercises provided today. Let me know if not improving.  Good to see you today Return as needed or in  1 year for next physical  Follow up plan: Return in about 1 year (around 10/22/2024) for annual exam, prior fasting for blood work.  Eustaquio Boyden, MD

## 2023-10-23 NOTE — Assessment & Plan Note (Addendum)
Preventative protocols reviewed and updated unless pt declined. Discussed healthy diet and lifestyle.  Has not completed colon cancer screening since 2017. Declines colonoscopy. Advised to complete iFOB this year.

## 2023-10-23 NOTE — Patient Instructions (Addendum)
Pass by lab to pick up stool kit.  Try exercises provided today. Let me know if not improving.  Good to see you today Return as needed or in 1 year for next physical

## 2023-11-14 ENCOUNTER — Encounter: Payer: Self-pay | Admitting: Medical

## 2023-11-14 ENCOUNTER — Ambulatory Visit: Payer: Managed Care, Other (non HMO) | Attending: Medical | Admitting: Medical

## 2023-11-14 VITALS — BP 115/82 | HR 59 | Ht 70.0 in | Wt 308.2 lb

## 2023-11-14 DIAGNOSIS — R0683 Snoring: Secondary | ICD-10-CM

## 2023-11-14 DIAGNOSIS — I480 Paroxysmal atrial fibrillation: Secondary | ICD-10-CM

## 2023-11-14 DIAGNOSIS — Z789 Other specified health status: Secondary | ICD-10-CM

## 2023-11-14 DIAGNOSIS — E782 Mixed hyperlipidemia: Secondary | ICD-10-CM

## 2023-11-14 DIAGNOSIS — I10 Essential (primary) hypertension: Secondary | ICD-10-CM

## 2023-11-14 DIAGNOSIS — Z720 Tobacco use: Secondary | ICD-10-CM

## 2023-11-14 NOTE — Patient Instructions (Signed)
Medication Instructions:  Your physician recommends that you continue on your current medications as directed. Please refer to the Current Medication list given to you today.   *If you need a refill on your cardiac medications before your next appointment, please call your pharmacy*   Lab Work: No labs ordered today    Testing/Procedures: No test ordered today    Follow-Up: At [facility], you and your health needs are our priority.  As part of our continuing mission to provide you with exceptional heart care, we have created designated Provider Care Teams.  These Care Teams include your primary Cardiologist (physician) and Advanced Practice Providers (APPs -  Physician Assistants and Nurse Practitioners) who all work together to provide you with the care you need, when you need it.  We recommend signing up for the patient portal called "MyChart".  Sign up information is provided on this After Visit Summary.  MyChart is used to connect with patients for Virtual Visits (Telemedicine).  Patients are able to view lab/test results, encounter notes, upcoming appointments, etc.  Non-urgent messages can be sent to your provider as well.   To learn more about what you can do with MyChart, go to [email or web address].    Your next appointment:   1 year(s)  Provider:   You may see Julien Nordmann, MD or one of the following Advanced Practice Providers on your designated Care Team:   Nicolasa Ducking, NP Eula Listen, PA-C Cadence Fransico Erikson, PA-C Charlsie Quest, NP Carlos Levering, NP

## 2023-11-14 NOTE — Progress Notes (Signed)
Cardiology Office Note:    Date:  11/14/2023   ID:  Eric Salazar, DOB [birth year], MRN [id number]  PCP:  Eric Boyden, MD  [facility] Cardiologist:  Julien Nordmann, MD  [facility] Electrophysiologist:  None   Referring MD: Eric Boyden, MD   Chief Complaint: 1 year follow-up  History of Present Illness:    Eric Salazar is a 62 y.o. male with a hx of  HTN, prior smoking history, prior alcohol use, one incidence of afib not on a/c, OSA not on CPAP who presents for follow-up.    Cardiac CTA showed coronary calcium score of 11 in July 2018. Echo in 2017 showed LVEF 60-65%, , no WMA.    Stress test 2021with no significant ischemia, normal LVEF 55%, CT imaging with no significant coronary calcifications,  verall low risk scan.  The patient was last seen 06/29/22 and was overall doing well.  He reported he stopped drinking alcohol.   Today, the patient is overall doing well. He denies chest pain, SOB, lower leg edema, lightheadedness, dizziness, orthopnea, pnd. He reports he does not have sleep apnea. He smokes 3 cigarettes daily. He drinks 2-3 alcoholic drinks during the week. He tries to walk in the morning 15-20 minutes. Diet is mostly cooking at home, but overall could be better.    Past Medical History:  Diagnosis Date   Agatston coronary artery calcium score less than 100    a. 06/2017 Cardiac CT: Cor Ca2+ = 11-- 54th%'ile.   Cellulitis and abscess of left leg 09/24/2018   History of tobacco abuse    a.Quit 2016.   Morbid obesity (HCC)    Paroxysmal atrial fibrillation (HCC)    a. 12/2015 Echo: EF 60-65%, no rwma; b. Converted on oral BB; c. CHA2DS2VASc = 0-->on ASA 81.   Precordial chest pain    a. 01/2011 Dobutamine Echo: No rwma->nl; b. 12/2015 Recurrent c/p in setting of AF RVR-->MV: mid ant/apical ant fixed defect, likely artifact. No ischemia. EF 55-65%.   Prediabetes     Past Surgical History:  Procedure Laterality Date   CARDIOVASCULAR STRESS  TEST  12/06/2015   low risk study, EF 55-65%   DOBUTAMINE STRESS ECHO  01/05/2011   for typical/atypical chest pain - good tolerance, 11 METS, no significant abnormalities   MRI lumbar  12/05/2006   disc protrusion L4/5 with impingement L5 and probably L4 nerve roots   MULTIPLE TOOTH EXTRACTIONS      Current Medications: Current Meds  Medication Sig   amLODipine (NORVASC) 2.5 MG tablet Take 1 tablet (2.5 mg total) by mouth daily.   aspirin EC 81 MG tablet Take 81 mg by mouth daily.   metoprolol tartrate (LOPRESSOR) 50 MG tablet Take 1 tablet (50 mg total) by mouth 2 (two) times daily.   Multiple Vitamin (MULTIVITAMIN WITH MINERALS) TABS tablet Take 1 tablet by mouth daily.     Allergies:   Patient has no known allergies.   Social History   Socioeconomic History   Marital status: Married    Spouse name: Not on file   Number of children: 1   Years of education: 2 yr coll   Highest education level: Not on file  Occupational History   Occupation: Event organiser: [employer]    Comment: Naval architect  Tobacco Use   Smoking status: Every Day    Types: Cigarettes   Smokeless tobacco: Never   Tobacco comments:    2-3 cigarettes per day  Vaping  Use   Vaping status: Never Used  Substance and Sexual Activity   Alcohol use: Yes    Alcohol/week: 3.0 standard drinks of alcohol    Types: 3 Shots of liquor per week    Comment: occasionally   Drug use: No   Sexual activity: Not on file  Other Topics Concern   Not on file  Social History Narrative   Caffeine: rareLives with wife, son (2002), 3 Art therapist.Edu: 2 years collegeActivity: not much, at work, playing with sonDiet: good fruits/vegetables   Social Determinants of Corporate investment banker Strain: Not on file  Food Insecurity: Not on file  Transportation Needs: Not on file  Physical Activity: Not on file  Stress: Not on file  Social Connections: Not on file     Family  History: The patient's family history includes Coronary artery disease (age of onset: 42) in his mother; Diabetes in his mother; Healthy in his sister; Heart attack in his mother; Hypertension in his father and mother. There is no history of Cancer.  ROS:   Please see the history of present illness.     All other systems reviewed and are negative.  EKGs/Labs/Other Studies Reviewed:    The following studies were reviewed today:  Myoview lexiscan 02/2020 Narrative & Impression  Pharmacological myocardial perfusion imaging study with no significant  ischemia Normal wall motion, EF estimated at 55% GI uptake artifact noted No EKG changes concerning for ischemia at peak stress or in recovery. CT attenuation correction images with no significant coronary calcification, no significant aortic atherosclerosis Low risk scan     Signed, Dossie Arbour, MD, Ph.D [facility]    CT 06/2017   FINDINGS: Non-cardiac: See separate report from [facility].   Ascending Aorta:  3.7 cm   Pericardium: Normal   Coronary arteries: Isolated punctate calcium noted in mid and distal LAD   IMPRESSION: Coronary calcium score of 11. This was 54th percentile for age and sex matched control.   Charlton Haws  Echo 12/2015 Study Conclusions   - Left ventricle: The cavity size was normal. There was mild    concentric hypertrophy. Systolic function was normal. The    estimated ejection fraction was in the range of 60% to 65%. Wall    motion was normal; there were no regional wall motion    abnormalities.      EKG:  EKG is ordered today.  The ekg ordered today demonstrates SB 59bpm, LAD, TWI III  Recent Labs: 01/16/2023: TSH 0.40 10/09/2023: ALT 32; BUN 19; Creatinine, Ser 1.02; Hemoglobin 14.5; Platelets 196.0; Potassium 4.3; Sodium 140  Recent Lipid Panel    Component Value Date/Time   CHOL 192 10/09/2023 0842   CHOL 175 08/08/2003 0000   TRIG 111.0 10/09/2023 0842   TRIG 72  08/08/2003 0000   HDL 58.80 10/09/2023 0842   CHOLHDL 3 10/09/2023 0842   VLDL 22.2 10/09/2023 0842   LDLCALC 111 (H) 10/09/2023 0842   LDLDIRECT 106 08/08/2003 0000    Physical Exam:    VS:  BP 115/82 (BP Location: Left Arm, Patient Position: Sitting, Cuff Size: Normal)   Pulse (!) 59   Ht 5\' 10"  (1.778 m)   Wt (!) 308 lb 3.2 oz (139.8 kg)   SpO2 98%   BMI 44.22 kg/m     Wt Readings from Last 3 Encounters:  11/14/23 (!) 308 lb 3.2 oz (139.8 kg)  10/23/23 (!) 301 lb 8 oz (136.8 kg)  01/16/23 299 lb 6  oz (135.8 kg)     GEN:  Well nourished, well developed in no acute distress HEENT: Normal NECK: No JVD; No carotid bruits LYMPHATICS: No lymphadenopathy CARDIAC: RRR, no murmurs, rubs, gallops RESPIRATORY:  Clear to auscultation without rales, wheezing or rhonchi  ABDOMEN: Soft, non-tender, non-distended MUSCULOSKELETAL:  No edema; No deformity  SKIN: Warm and dry NEUROLOGIC:  Alert and oriented x 3 PSYCHIATRIC:  Normal affect   ASSESSMENT:    1. Essential hypertension   2. Paroxysmal atrial fibrillation (HCC)   3. Paroxysmal A-fib (HCC)   4. Snoring   5. Tobacco use   6. Alcohol use   7. Hyperlipidemia, mixed    PLAN:    In order of problems listed above:  HTN Blood pressure is good today 115/82.  Continue amlodipine 2.5 mg daily and Lopressor 50 mg twice daily.  Diet and lifestyle changes discussed.  A-fib, one occurrence EKG today shows sinus bradycardia.  He denies any heart racing or palpitations.  Continue Lopressor for rate control.  He is not on anticoagulation due to 1 occurrence of A-fib.  Possible OSA Patient denies diagnosis of sleep apnea.  Tobacco use He is smoking 2 to 3 cigarettes daily.  Alcohol use He reports 2-3 alcoholic beverages weekly.  HLD According to ASCVD risk calculator is 12.9% recommending moderate to high intensity statin.  Patient would like to discuss this with PCP.  Disposition: Follow up in 1 year(s) with MD/APP     Signed, Keirah Konitzer David Stall, PA-C  11/14/2023 9:54 AM    [facility]

## 2024-05-30 ENCOUNTER — Ambulatory Visit: Admitting: Pulmonary Disease

## 2024-05-30 ENCOUNTER — Encounter: Payer: Self-pay | Admitting: Pulmonary Disease

## 2024-05-30 VITALS — BP 154/69 | HR 57 | Ht 70.0 in | Wt 309.0 lb

## 2024-05-30 DIAGNOSIS — F17210 Nicotine dependence, cigarettes, uncomplicated: Secondary | ICD-10-CM | POA: Diagnosis not present

## 2024-05-30 NOTE — Progress Notes (Signed)
 Synopsis: Referred in June 2025 for Smoking  Subjective:   PATIENT ID: Eric Salazar GENDER: male DOB: Apr 17, 1961, MRN: [id number]  HPI  Chief Complaint  Patient presents with   Consult    Pt states unsure why here....    Eric Salazar is a 63 year old male, daily smoker with coronary artery disease, obesity and prediabetes who is referred to pulmonary clinic for history of smoking.  Patient saw Dr. Kasa in 2017 for excessive daytime sleepiness. Home sleep study showed AHI 8.8/hr.   He has a significant smoking history, having started as a teenager and previously smoking half a pack per day. Currently, he smokes two to three cigarettes daily after a recent quit attempt lasting six to eight weeks. He expresses a desire to quit smoking permanently. He has a 20+ pack smoking history.  He denies shortness of breath, even with physical exertion such as walking up inclines or stairs. He also denies any leg swelling.  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  Past Medical History:  Diagnosis Date   Agatston coronary artery calcium score less than 100    a. 06/2017 Cardiac CT: Cor Ca2+ = 11-- 54th%'ile.   Cellulitis and abscess of left leg 09/24/2018   History of tobacco abuse    a.Quit 2016.   Morbid obesity (HCC)    Paroxysmal atrial fibrillation (HCC)    a. 12/2015 Echo: EF 60-65%, no rwma; b. Converted on oral BB; c. CHA2DS2VASc = 0-->on ASA 81.   Precordial chest pain    a. 01/2011 Dobutamine  Echo: No rwma->nl; b. 12/2015 Recurrent c/p in setting of AF RVR-->MV: mid ant/apical ant fixed defect, likely artifact. No ischemia. EF 55-65%.   Prediabetes      Family History  Problem Relation Age of Onset   Hypertension Mother    Diabetes Mother        prediabetes   Coronary artery disease Mother 60       MI   Heart attack Mother    Hypertension Father    Healthy Sister     Cancer Neg Hx      Social History   Socioeconomic History   Marital status: Married    Spouse name: Not on file   Number of children: 1   Years of education: 2 yr coll   Highest education level: Not on file  Occupational History   Occupation: Event organiser: [employer]    Comment: Naval architect  Tobacco Use   Smoking status: Every Day    Types: Cigarettes   Smokeless tobacco: Never   Tobacco comments:    2-3 cigarettes per day  Vaping Use   Vaping status: Never Used  Substance and Sexual Activity   Alcohol use: Yes    Alcohol/week: 3.0 standard drinks of alcohol    Types: 3 Shots of liquor per week    Comment: occasionally   Drug use: No   Sexual activity: Not on file  Other Topics Concern   Not on file  Social History Narrative   Caffeine: rareLives with wife, son (2002), 3 Art therapist.Edu: 2 years collegeActivity: not much, at work, playing with sonDiet: good fruits/vegetables   Social Drivers of Corporate investment banker Strain: Not on file  Food Insecurity: Not on file  Transportation Needs: Not on file  Physical Activity: Not on file  Stress:  Not on file  Social Connections: Not on file  Intimate Partner Violence: Not on file     No Known Allergies   Outpatient Medications Prior to Visit  Medication Sig Dispense Refill   amLODipine  (NORVASC ) 2.5 MG tablet Take 1 tablet (2.5 mg total) by mouth daily. 90 tablet 4   aspirin  EC 81 MG tablet Take 81 mg by mouth daily.     metoprolol  tartrate (LOPRESSOR ) 50 MG tablet Take 1 tablet (50 mg total) by mouth 2 (two) times daily. 180 tablet 4   Multiple Vitamin (MULTIVITAMIN WITH MINERALS) TABS tablet Take 1 tablet by mouth daily.     No facility-administered medications prior to visit.    Review of Systems  Constitutional:  Negative for chills, fever, malaise/fatigue and weight loss.  HENT:  Negative for congestion, sinus pain and sore throat.   Eyes: Negative.    Respiratory:  Negative for cough, hemoptysis, sputum production, shortness of breath and wheezing.   Cardiovascular:  Negative for chest pain, palpitations, orthopnea, claudication and leg swelling.  Gastrointestinal:  Negative for abdominal pain, heartburn, nausea and vomiting.  Genitourinary: Negative.   Musculoskeletal:  Negative for joint pain and myalgias.  Skin:  Negative for rash.  Neurological:  Negative for weakness.  Endo/Heme/Allergies: Negative.   Psychiatric/Behavioral: Negative.        Objective:   Vitals:   05/30/24 0858  BP: (!) 154/69  Pulse: (!) 57  SpO2: 97%  Weight: (!) 309 lb (140.2 kg)  Height: 5' 10 (1.778 m)     Physical Exam Constitutional:      General: He is not in acute distress.    Appearance: Normal appearance. He is obese.   Eyes:     General: No scleral icterus.    Conjunctiva/sclera: Conjunctivae normal.    Cardiovascular:     Rate and Rhythm: Normal rate and regular rhythm.  Pulmonary:     Breath sounds: No wheezing, rhonchi or rales.   Musculoskeletal:     Right lower leg: No edema.     Left lower leg: No edema.   Skin:    General: Skin is warm and dry.   Neurological:     General: No focal deficit present.    CBC    Component Value Date/Time   WBC 5.6 10/09/2023 0842   RBC 4.63 10/09/2023 0842   HGB 14.5 10/09/2023 0842   HCT 44.5 10/09/2023 0842   PLT 196.0 10/09/2023 0842   MCV 96.0 10/09/2023 0842   MCH 30.9 10/02/2022 0916   MCHC 32.6 10/09/2023 0842   RDW 13.6 10/09/2023 0842   LYMPHSABS 1.5 10/09/2023 0842   MONOABS 0.6 10/09/2023 0842   EOSABS 0.2 10/09/2023 0842   BASOSABS 0.0 10/09/2023 0842      Latest Ref Rng & Units 10/09/2023    8:42 AM 01/16/2023   10:23 AM 10/02/2022    9:16 AM  BMP  Glucose 70 - 99 mg/dL 898  899  872   BUN 6 - 23 mg/dL 19  23  16    Creatinine 0.40 - 1.50 mg/dL 8.97  9.00  8.99   Sodium 135 - 145 mEq/L 140  140  136   Potassium 3.5 - 5.1 mEq/L 4.3  4.4  3.8   Chloride  96 - 112 mEq/L 101  101  102   CO2 19 - 32 mEq/L 33  31  24   Calcium 8.4 - 10.5 mg/dL 9.3  9.1  8.7    Chest imaging:  PFT:  No data to display          Labs:  Path:  Echo:  Heart Catheterization:       Assessment & Plan:   Cigarette smoker  Discussion: Eric Salazar is a 63 year old male, daily smoker with coronary artery disease, obesity and prediabetes who is referred to pulmonary clinic for history of smoking.  Tobacco use Long-term tobacco use with intermittent smoking. Intends to quit permanently. Discussed smoking cessation options for 3 minutes. - Recommend mini nicotine lozenges (2 mg) for cessation. - Advise against nicotine patches due to low smoking frequency. - Discussed nicotine overdose symptoms and risks of combining patches and smoking. - Encourage seeking support if difficulties in quitting arise.  Risk of lung cancer Eligible for screening due to age and 20+ pack smoking history. - Refer to lung cancer screening program. - Prefers having scans done at [facility]   Follow up in 1 year, call sooner if needed.  Dorn Chill, MD [facility] Office: [phone]   Current Outpatient Medications:    amLODipine  (NORVASC ) 2.5 MG tablet, Take 1 tablet (2.5 mg total) by mouth daily., Disp: 90 tablet, Rfl: 4   aspirin  EC 81 MG tablet, Take 81 mg by mouth daily., Disp: , Rfl:    metoprolol  tartrate (LOPRESSOR ) 50 MG tablet, Take 1 tablet (50 mg total) by mouth 2 (two) times daily., Disp: 180 tablet, Rfl: 4   Multiple Vitamin (MULTIVITAMIN WITH MINERALS) TABS tablet, Take 1 tablet by mouth daily., Disp: , Rfl:

## 2024-05-30 NOTE — Patient Instructions (Addendum)
 Based on your smoking history, you qualify for the lung cancer screening program  Agree with trying to quit smoking - consider using mini nicotine lozenges 2mg  as needed for cigarette cravings  Follow up in 1 year, call sooner if needed

## 2024-10-23 ENCOUNTER — Other Ambulatory Visit: Payer: Self-pay | Admitting: Family Medicine

## 2024-10-23 DIAGNOSIS — I10 Essential (primary) hypertension: Secondary | ICD-10-CM

## 2024-10-24 ENCOUNTER — Other Ambulatory Visit: Payer: Self-pay | Admitting: Family Medicine

## 2024-10-24 DIAGNOSIS — I10 Essential (primary) hypertension: Secondary | ICD-10-CM

## 2024-10-25 NOTE — Telephone Encounter (Unsigned)
 Copied from CRM [id number]. Topic: Clinical - Medication Question >> Oct 24, 2024  2:36 PM Harlene ORN wrote: Reason for CRM: Patient called, saying his metoprolol  tartrate (LOPRESSOR ) 50 MG tablet order was denied by his Practice. Informed patient the prescription order for the medication was sent 11/20 @ 8:50 am and to call the pharmacy back to follow up with them. He agreed to call them back.

## 2024-11-22 ENCOUNTER — Other Ambulatory Visit: Payer: Self-pay | Admitting: Family Medicine

## 2024-11-22 DIAGNOSIS — I10 Essential (primary) hypertension: Secondary | ICD-10-CM

## 2024-11-22 NOTE — Telephone Encounter (Signed)
 E-scribed rx.  Pls schedule CPE and fasting labs for additional refills.

## 2024-11-25 NOTE — Telephone Encounter (Signed)
 Lvm to schedule CPE and prior fasting lab

## 2024-12-04 ENCOUNTER — Ambulatory Visit

## 2024-12-04 DIAGNOSIS — L600 Ingrowing nail: Secondary | ICD-10-CM

## 2024-12-04 NOTE — Progress Notes (Signed)
 "  Subjective:  Patient ID: Eric Salazar, male    DOB: 25-Oct-1961,  MRN: [id number]  Chief Complaint  Patient presents with   Ingrown Toenail    Rm7 Ingrown toenail right big toe medial border/ several weeks/ hurts with pressure.    63 y.o. male presents with the above complaint.  He states that he has had pain to his right big toe medial border for several weeks.  It hurts with palpation and shoe gear.  He believes that it started when he wore a tight pair of shoes.  He denies seeing any purulence or infection from the toe.   Review of Systems: Negative except as noted in the HPI. Denies N/V/F/Ch.  Past Medical History:  Diagnosis Date   Agatston coronary artery calcium score less than 100    a. 06/2017 Cardiac CT: Cor Ca2+ = 11-- 54th%'ile.   Cellulitis and abscess of left leg 09/24/2018   History of tobacco abuse    a.Quit 2016.   Morbid obesity (HCC)    Paroxysmal atrial fibrillation (HCC)    a. 12/2015 Echo: EF 60-65%, no rwma; b. Converted on oral BB; c. CHA2DS2VASc = 0-->on ASA 81.   Precordial chest pain    a. 01/2011 Dobutamine  Echo: No rwma->nl; b. 12/2015 Recurrent c/p in setting of AF RVR-->MV: mid ant/apical ant fixed defect, likely artifact. No ischemia. EF 55-65%.   Prediabetes    Current Medications[1]  Tobacco Use History[2]  Allergies[3] Objective:  There were no vitals filed for this visit. There is no height or weight on file to calculate BMI. Constitutional Well developed. Well nourished.  Vascular Dorsalis pedis pulses palpable bilaterally. Posterior tibial pulses palpable bilaterally. Capillary refill normal to all digits.  No cyanosis or clubbing noted. Pedal hair growth normal.  Neurologic Normal speech. Oriented to person, place, and time. Epicritic sensation to light touch grossly present bilaterally.  Dermatologic Painful ingrowing nail at medial nail borders of the hallux nail right. No other open wounds. No skin lesions.  Orthopedic:  Normal joint ROM without pain or crepitus bilaterally. No visible deformities. No bony tenderness.   Radiographs: None Assessment:   1. Ingrown right big toenail    Plan:  Patient was evaluated and treated and all questions answered.  Ingrown Nail, right -Discussed treatment options ranging from surgical to conservative along with risks and benefits of each. Patient expresses understanding. Patient elects to proceed with minor surgery to remove ingrown toenail removal today. Consent reviewed and signed by patient. -Ingrown nail excised. See procedure note. -Educated on post-procedure care including soaking. Written instructions provided and reviewed. -Patient to follow up in 2 weeks for nail check.  Procedure: Excision of Ingrown Toenail Location: Right 1st toe medial nail borders. Anesthesia: Lidocaine 1% plain; 1.5 mL and Marcaine 0.5% plain; 1.5 mL, digital block. Skin Prep: Betadine. Dressing: Silvadene; telfa; dry, sterile, compression dressing. Technique: Following skin prep, the toe was exsanguinated and a tourniquet was secured at the base of the toe. The affected nail border was freed, split with a nail splitter, and excised. Chemical matrixectomy was then performed with phenol and irrigated out with alcohol. The tourniquet was then removed and sterile dressing applied. Disposition: Patient tolerated procedure well. Patient to return in 2 weeks for follow-up.  Post op care: Keep dressing clean, dry, and intact for 24 hours before removing. Soak operative foot and redress BID as printed instructions describe. Patient educated on signs of infection, pt expresses understanding and will proceed for prompt medical care if  signs of infection arise.    Prentice Ovens, DPM AACFAS Fellowship Trained Podiatric Surgeon [facility]     [1]  Current Outpatient Medications:    amLODipine  (NORVASC ) 2.5 MG tablet, Take 1 tablet (2.5 mg total) by mouth daily., Disp: 90  tablet, Rfl: 4   aspirin  EC 81 MG tablet, Take 81 mg by mouth daily., Disp: , Rfl:    metoprolol  tartrate (LOPRESSOR ) 50 MG tablet, TAKE 1 TABLET(50 MG) BY MOUTH TWICE DAILY, Disp: 60 tablet, Rfl: 0   Multiple Vitamin (MULTIVITAMIN WITH MINERALS) TABS tablet, Take 1 tablet by mouth daily., Disp: , Rfl:  [2]  Social History Tobacco Use  Smoking Status Every Day   Types: Cigarettes  Smokeless Tobacco Never  Tobacco Comments   2-3 cigarettes per day  [3] No Known Allergies  "

## 2024-12-04 NOTE — Patient Instructions (Signed)

## 2024-12-13 ENCOUNTER — Other Ambulatory Visit: Payer: Self-pay | Admitting: Family Medicine

## 2024-12-13 DIAGNOSIS — I10 Essential (primary) hypertension: Secondary | ICD-10-CM

## 2024-12-21 ENCOUNTER — Other Ambulatory Visit: Payer: Self-pay | Admitting: Family Medicine

## 2024-12-21 DIAGNOSIS — I10 Essential (primary) hypertension: Secondary | ICD-10-CM

## 2024-12-23 ENCOUNTER — Other Ambulatory Visit: Payer: Self-pay | Admitting: Family Medicine

## 2024-12-23 DIAGNOSIS — I10 Essential (primary) hypertension: Secondary | ICD-10-CM

## 2024-12-23 NOTE — Telephone Encounter (Unsigned)
 Copied from CRM #[id number]. Topic: Clinical - Medication Refill >> Dec 23, 2024  9:34 AM Jayma L wrote: Medication:   amLODipine  (NORVASC ) 2.5 MG tablet  Has the patient contacted their pharmacy? Yes (Agent: If no, request that the patient contact the pharmacy for the refill. If patient does not wish to contact the pharmacy document the reason why and proceed with request.) (Agent: If yes, when and what did the pharmacy advise?)  This is the patient's preferred pharmacy:  [id number] - [place], [place] - [address] [facility] [address] [facility] [address] [place] [address] Phone: [phone] Fax: [phone]  Is this the correct pharmacy for this prescription? Yes If no, delete pharmacy and type the correct one.   Has the prescription been filled recently? No  Is the patient out of the medication? No  Has the patient been seen for an appointment in the last year OR does the patient have an upcoming appointment? Yes  Can we respond through MyChart? Yes  Agent: Please be advised that Rx refills may take up to 3 business days. We ask that you follow-up with your pharmacy.

## 2024-12-26 MED ORDER — AMLODIPINE BESYLATE 2.5 MG PO TABS: 2.5000 mg | ORAL_TABLET | Freq: Every day | ORAL | 0 refills | Status: DC

## 2025-01-02 ENCOUNTER — Telehealth: Payer: Self-pay | Admitting: Family Medicine

## 2025-01-02 DIAGNOSIS — Z1211 Encounter for screening for malignant neoplasm of colon: Secondary | ICD-10-CM

## 2025-01-02 NOTE — Telephone Encounter (Signed)
 Copied from CRM #[id number]. Topic: General - Other >> Jan 02, 2025  9:19 AM Emylou G wrote: Reason for CRM: Patient called.Eric Salazar lost his stool sample kit.. Wants to come by today or tomorrow and pick up another so he can drop it off Monday.Eric Salazar

## 2025-01-02 NOTE — Telephone Encounter (Signed)
 Patient or his wife is going to come by and pick up a new IFOB kit. Dr. KANDICE can we get an updated order for that so when he comes in Monday for his appt and drops off we will have that order ready?  Thanks

## 2025-01-03 NOTE — Addendum Note (Signed)
 Addended by: RILLA BALLER on: 01/03/2025 07:09 AM   Modules accepted: Orders

## 2025-01-03 NOTE — Telephone Encounter (Signed)
 New order placed.

## 2025-01-06 ENCOUNTER — Ambulatory Visit: Payer: Self-pay | Admitting: Family Medicine

## 2025-01-10 ENCOUNTER — Other Ambulatory Visit: Payer: Self-pay

## 2025-01-10 ENCOUNTER — Encounter: Payer: Self-pay | Admitting: Family Medicine

## 2025-01-10 ENCOUNTER — Ambulatory Visit: Payer: Self-pay | Admitting: Family Medicine

## 2025-01-10 VITALS — BP 130/76 | HR 68 | Temp 98.0°F | Ht 69.75 in | Wt 304.2 lb

## 2025-01-10 DIAGNOSIS — E782 Mixed hyperlipidemia: Secondary | ICD-10-CM

## 2025-01-10 DIAGNOSIS — Z23 Encounter for immunization: Secondary | ICD-10-CM

## 2025-01-10 DIAGNOSIS — I10 Essential (primary) hypertension: Secondary | ICD-10-CM

## 2025-01-10 DIAGNOSIS — Z720 Tobacco use: Secondary | ICD-10-CM

## 2025-01-10 DIAGNOSIS — Z0000 Encounter for general adult medical examination without abnormal findings: Secondary | ICD-10-CM

## 2025-01-10 DIAGNOSIS — Z125 Encounter for screening for malignant neoplasm of prostate: Secondary | ICD-10-CM

## 2025-01-10 DIAGNOSIS — Z1211 Encounter for screening for malignant neoplasm of colon: Secondary | ICD-10-CM

## 2025-01-10 DIAGNOSIS — R7401 Elevation of levels of liver transaminase levels: Secondary | ICD-10-CM

## 2025-01-10 DIAGNOSIS — I4891 Unspecified atrial fibrillation: Secondary | ICD-10-CM

## 2025-01-10 DIAGNOSIS — R7303 Prediabetes: Secondary | ICD-10-CM

## 2025-01-10 LAB — CBC WITH DIFFERENTIAL/PLATELET
Basophils Absolute: 0 10*3/uL (ref 0.0–0.1)
Basophils Relative: 0.7 % (ref 0.0–3.0)
Eosinophils Absolute: 0.2 10*3/uL (ref 0.0–0.7)
Eosinophils Relative: 3.7 % (ref 0.0–5.0)
HCT: 41.9 % (ref 39.0–52.0)
Hemoglobin: 13.9 g/dL (ref 13.0–17.0)
Lymphocytes Relative: 25.4 % (ref 12.0–46.0)
Lymphs Abs: 1.5 10*3/uL (ref 0.7–4.0)
MCHC: 33.2 g/dL (ref 30.0–36.0)
MCV: 94.8 fl (ref 78.0–100.0)
Monocytes Absolute: 0.6 10*3/uL (ref 0.1–1.0)
Monocytes Relative: 10.8 % (ref 3.0–12.0)
Neutro Abs: 3.5 10*3/uL (ref 1.4–7.7)
Neutrophils Relative %: 59.4 % (ref 43.0–77.0)
Platelets: 194 10*3/uL (ref 150.0–400.0)
RBC: 4.42 Mil/uL (ref 4.22–5.81)
RDW: 14 % (ref 11.5–15.5)
WBC: 5.9 10*3/uL (ref 4.0–10.5)

## 2025-01-10 LAB — LIPID PANEL
Cholesterol: 175 mg/dL (ref 28–200)
HDL: 54.2 mg/dL
LDL Cholesterol: 96 mg/dL (ref 10–99)
NonHDL: 120.4
Total CHOL/HDL Ratio: 3
Triglycerides: 123 mg/dL (ref 10.0–149.0)
VLDL: 24.6 mg/dL (ref 0.0–40.0)

## 2025-01-10 LAB — MICROALBUMIN / CREATININE URINE RATIO
Creatinine,U: 114 mg/dL
Microalb Creat Ratio: 8.5 mg/g (ref 0.0–30.0)
Microalb, Ur: 1 mg/dL (ref 0.7–1.9)

## 2025-01-10 LAB — COMPREHENSIVE METABOLIC PANEL WITH GFR
ALT: 25 U/L (ref 3–53)
AST: 23 U/L (ref 5–37)
Albumin: 3.9 g/dL (ref 3.5–5.2)
Alkaline Phosphatase: 96 U/L (ref 39–117)
BUN: 20 mg/dL (ref 6–23)
CO2: 31 meq/L (ref 19–32)
Calcium: 8.9 mg/dL (ref 8.4–10.5)
Chloride: 103 meq/L (ref 96–112)
Creatinine, Ser: 0.95 mg/dL (ref 0.40–1.50)
GFR: 85.35 mL/min
Glucose, Bld: 90 mg/dL (ref 70–99)
Potassium: 4.2 meq/L (ref 3.5–5.1)
Sodium: 140 meq/L (ref 135–145)
Total Bilirubin: 0.5 mg/dL (ref 0.2–1.2)
Total Protein: 6.9 g/dL (ref 6.0–8.3)

## 2025-01-10 LAB — PSA: PSA: 4.61 ng/mL — ABNORMAL HIGH (ref 0.10–4.00)

## 2025-01-10 LAB — HEMOGLOBIN A1C: Hgb A1c MFr Bld: 5.8 % (ref 4.6–6.5)

## 2025-01-10 MED ORDER — AMLODIPINE BESYLATE 2.5 MG PO TABS: 2.5000 mg | ORAL_TABLET | Freq: Every day | ORAL | 3 refills | Status: AC

## 2025-01-10 MED ORDER — METOPROLOL TARTRATE 50 MG PO TABS: 50.0000 mg | ORAL_TABLET | Freq: Two times a day (BID) | ORAL | 3 refills | Status: AC

## 2025-01-10 NOTE — Progress Notes (Signed)
 " Ph: [phone] Fax: [phone]   Patient ID: Eric Salazar, male    DOB: March 09, 1961, 64 y.o.   MRN: [id number]  This visit was conducted in person.  BP 130/76 (BP Location: Right Arm, Patient Position: Sitting, Cuff Size: Large)   Pulse 68   Temp 98 F (36.7 C) (Oral)   Ht 5' 9.75 (1.772 m)   Wt (!) 304 lb 3.2 oz (138 kg)   SpO2 97%   BMI 43.96 kg/m    CC: CPE Subjective:   HPI: Eric Salazar is a 64 y.o. male presenting on 01/10/2025 for Annual Exam (Would like to discuss prevnar and shingrix vax)   Gets CDL yearly.  Not fasting today.   Preventative: Colon cancer screening - iFOB neg 12/2015. He submitted iFOB today.  Prostate cancer screening - discussed. Yearly PSA. Nocturia x1-2, strong stream.  Lung cancer screening - 40 yr history, about 1/2 ppd. referred last year, never completed. Interested in re-referral.  Flu - declines  COVID vaccine Pfizer 02/2020, 03/2020, booster 11/2020  Tdap 07/2011, Tdap 07/2022 Shingrix - discussed  Seat belt use discussed  Sunscreen use discussed. No changing moles on skin  Sleep - averaging 7-8 hours/night Smoker - 3-4 cig/day  Alcohol - rare  Dentist - has partial denture, sees regularly Eye exam - none recently   Caffeine: rare   Lives with wife, son (2002), 3 cats  Occ: truck driver since [address] Edu: 2 years college Activity: walking 30 min/day in am  Diet: good water, some vegetables, working on healthy diet changes      Relevant past medical, surgical, family and social history reviewed and updated as indicated. Interim medical history since our last visit reviewed. Allergies and medications reviewed and updated. Outpatient Medications Prior to Visit  Medication Sig Dispense Refill   aspirin  EC 81 MG tablet Take 81 mg by mouth daily.     Multiple Vitamin (MULTIVITAMIN WITH MINERALS) TABS tablet Take 1 tablet by mouth daily.     amLODipine  (NORVASC ) 2.5 MG tablet Take 1 tablet (2.5 mg total) by mouth daily. 90  tablet 0   metoprolol  tartrate (LOPRESSOR ) 50 MG tablet TAKE 1 TABLET(50 MG) BY MOUTH TWICE DAILY 60 tablet 0   No facility-administered medications prior to visit.     Per HPI unless specifically indicated in ROS section below Review of Systems  Constitutional:  Negative for activity change, appetite change, chills, fatigue, fever and unexpected weight change.  HENT:  Negative for hearing loss.   Eyes:  Negative for visual disturbance.  Respiratory:  Negative for cough, chest tightness, shortness of breath and wheezing.   Cardiovascular:  Negative for chest pain, palpitations and leg swelling.  Gastrointestinal:  Negative for abdominal distention, abdominal pain, blood in stool, constipation, diarrhea, nausea and vomiting.  Genitourinary:  Negative for difficulty urinating and hematuria.  Musculoskeletal:  Negative for arthralgias, myalgias and neck pain.  Skin:  Negative for rash.  Neurological:  Negative for dizziness, seizures, syncope and headaches.  Hematological:  Negative for adenopathy. Does not bruise/bleed easily.  Psychiatric/Behavioral:  Negative for dysphoric mood. The patient is not nervous/anxious.     Objective:  BP 130/76 (BP Location: Right Arm, Patient Position: Sitting, Cuff Size: Large)   Pulse 68   Temp 98 F (36.7 C) (Oral)   Ht 5' 9.75 (1.772 m)   Wt (!) 304 lb 3.2 oz (138 kg)   SpO2 97%   BMI 43.96 kg/m   Wt Readings from Last 3  Encounters:  01/10/25 (!) 304 lb 3.2 oz (138 kg)  05/30/24 (!) 309 lb (140.2 kg)  11/14/23 (!) 308 lb 3.2 oz (139.8 kg)      Physical Exam Vitals and nursing note reviewed.  Constitutional:      General: He is not in acute distress.    Appearance: Normal appearance. He is well-developed. He is not ill-appearing.  HENT:     Head: Normocephalic and atraumatic.     Right Ear: Hearing, tympanic membrane, ear canal and external ear normal.     Left Ear: Hearing, tympanic membrane, ear canal and external ear normal.      Mouth/Throat:     Mouth: Mucous membranes are moist.     Pharynx: Oropharynx is clear. No oropharyngeal exudate or posterior oropharyngeal erythema.  Eyes:     General: No scleral icterus.    Extraocular Movements: Extraocular movements intact.     Conjunctiva/sclera: Conjunctivae normal.     Pupils: Pupils are equal, round, and reactive to light.  Neck:     Thyroid : No thyroid  mass or thyromegaly.  Cardiovascular:     Rate and Rhythm: Normal rate and regular rhythm.     Pulses: Normal pulses.          Radial pulses are 2+ on the right side and 2+ on the left side.     Heart sounds: Normal heart sounds. No murmur heard. Pulmonary:     Effort: Pulmonary effort is normal. No respiratory distress.     Breath sounds: Normal breath sounds. No wheezing, rhonchi or rales.  Abdominal:     General: Bowel sounds are normal. There is no distension.     Palpations: Abdomen is soft. There is no mass.     Tenderness: There is no abdominal tenderness. There is no guarding or rebound.     Hernia: No hernia is present.  Musculoskeletal:        General: Normal range of motion.     Cervical back: Normal range of motion and neck supple.     Right lower leg: No edema.     Left lower leg: No edema.  Lymphadenopathy:     Cervical: No cervical adenopathy.  Skin:    General: Skin is warm and dry.     Findings: No rash.  Neurological:     General: No focal deficit present.     Mental Status: He is alert and oriented to person, place, and time.  Psychiatric:        Mood and Affect: Mood normal.        Behavior: Behavior normal.        Thought Content: Thought content normal.        Judgment: Judgment normal.       Results for orders placed or performed in visit on 10/09/23  Hemoglobin A1c   Collection Time: 10/09/23  8:42 AM  Result Value Ref Range   Hgb A1c MFr Bld 5.7 4.6 - 6.5 %  PSA   Collection Time: 10/09/23  8:42 AM  Result Value Ref Range   PSA 3.71 0.10 - 4.00 ng/mL  CBC with  Differential/Platelet   Collection Time: 10/09/23  8:42 AM  Result Value Ref Range   WBC 5.6 4.0 - 10.5 K/uL   RBC 4.63 4.22 - 5.81 Mil/uL   Hemoglobin 14.5 13.0 - 17.0 g/dL   HCT 55.4 60.9 - 47.9 %   MCV 96.0 78.0 - 100.0 fl   MCHC 32.6 30.0 - 36.0 g/dL  RDW 13.6 11.5 - 15.5 %   Platelets 196.0 150.0 - 400.0 K/uL   Neutrophils Relative % 57.2 43.0 - 77.0 %   Lymphocytes Relative 27.8 12.0 - 46.0 %   Monocytes Relative 10.8 3.0 - 12.0 %   Eosinophils Relative 3.4 0.0 - 5.0 %   Basophils Relative 0.8 0.0 - 3.0 %   Neutro Abs 3.2 1.4 - 7.7 K/uL   Lymphs Abs 1.5 0.7 - 4.0 K/uL   Monocytes Absolute 0.6 0.1 - 1.0 K/uL   Eosinophils Absolute 0.2 0.0 - 0.7 K/uL   Basophils Absolute 0.0 0.0 - 0.1 K/uL  Comprehensive metabolic panel   Collection Time: 10/09/23  8:42 AM  Result Value Ref Range   Sodium 140 135 - 145 mEq/L   Potassium 4.3 3.5 - 5.1 mEq/L   Chloride 101 96 - 112 mEq/L   CO2 33 (H) 19 - 32 mEq/L   Glucose, Bld 101 (H) 70 - 99 mg/dL   BUN 19 6 - 23 mg/dL   Creatinine, Ser 8.97 0.40 - 1.50 mg/dL   Total Bilirubin 1.0 0.2 - 1.2 mg/dL   Alkaline Phosphatase 100 39 - 117 U/L   AST 29 0 - 37 U/L   ALT 32 0 - 53 U/L   Total Protein 7.0 6.0 - 8.3 g/dL   Albumin 3.9 3.5 - 5.2 g/dL   GFR 20.92 >39.99 mL/min   Calcium 9.3 8.4 - 10.5 mg/dL  Lipid panel   Collection Time: 10/09/23  8:42 AM  Result Value Ref Range   Cholesterol 192 0 - 200 mg/dL   Triglycerides 888.9 0.0 - 149.0 mg/dL   HDL 41.19 >60.99 mg/dL   VLDL 77.7 0.0 - 59.9 mg/dL   LDL Cholesterol 888 (H) 0 - 99 mg/dL   Total CHOL/HDL Ratio 3    NonHDL 133.45     Assessment & Plan:   Problem List Items Addressed This Visit     Healthcare maintenance - Primary (Chronic)   Preventative protocols reviewed and updated unless pt declined. Discussed healthy diet and lifestyle.       Tobacco use   Continue to encourage cessation. # provided to schedule lung cancer screening CT      Morbid obesity with BMI of  40.0-44.9, adult (HCC)   Encourage healthy diet and lifestyle choices to affect sustainable weight loss       Prediabetes   Update A1c. Encouraged limiting added sugar in diet.       Relevant Orders   Hemoglobin A1c   Essential hypertension   Chronic, stable on current regimen - continue.       Relevant Medications   metoprolol  tartrate (LOPRESSOR ) 50 MG tablet   amLODipine  (NORVASC ) 2.5 MG tablet   Other Relevant Orders   CBC with Differential/Platelet   Microalbumin / creatinine urine ratio   Lone atrial fibrillation (HCC)   Isolated episode without recurrence. Only on metoprolol  and aspirin .  Discussed OSA screen with HST - he will consider. ESS = 2 today      Relevant Medications   metoprolol  tartrate (LOPRESSOR ) 50 MG tablet   amLODipine  (NORVASC ) 2.5 MG tablet   Hyperlipidemia   Chronic, update fasting lipid panel off statin.  Reviewed prior ASCVD risk score - will updated cardiac CT with calcium score. The 10-year ASCVD risk score (Arnett DK, et al., 2019) is: 16.6%   Values used to calculate the score:     Age: 50 years     Clinically relevant sex: Male  Is Non-Hispanic African American: No     Diabetic: No     Tobacco smoker: Yes     Systolic Blood Pressure: 130 mmHg     Is BP treated: Yes     HDL Cholesterol: 58.8 mg/dL     Total Cholesterol: 192 mg/dL       Relevant Medications   metoprolol  tartrate (LOPRESSOR ) 50 MG tablet   amLODipine  (NORVASC ) 2.5 MG tablet   Other Relevant Orders   Lipid panel   Comprehensive metabolic panel with GFR   CT CARDIAC SCORING (SELF PAY ONLY)   Transaminitis   Update LFTs, CBC today .      Relevant Orders   Comprehensive metabolic panel with GFR   CBC with Differential/Platelet   Other Visit Diagnoses       Special screening for malignant neoplasm of prostate       Relevant Orders   PSA     Need for shingles vaccine       Relevant Orders   Varicella-zoster vaccine IM (Completed)        Meds ordered  this encounter  Medications   metoprolol  tartrate (LOPRESSOR ) 50 MG tablet    Sig: Take 1 tablet (50 mg total) by mouth 2 (two) times daily.    Dispense:  180 tablet    Refill:  3   amLODipine  (NORVASC ) 2.5 MG tablet    Sig: Take 1 tablet (2.5 mg total) by mouth daily.    Dispense:  90 tablet    Refill:  3    Orders Placed This Encounter  Procedures   CT CARDIAC SCORING (SELF PAY ONLY)    Standing Status:   Future    Expiration Date:   01/10/2026    Preferred imaging location?:   [facility]   Varicella-zoster vaccine IM   Lipid panel   Comprehensive metabolic panel with GFR   Hemoglobin A1c   PSA   CBC with Differential/Platelet   Microalbumin / creatinine urine ratio    Patient Instructions  Labs today  I will order heart CT with calcium score - you may call [phone]   to set this up. This is $100.  Call to set up lung cancer screening CT at [phone].  Consider in the future future - screening or sleep apnea.  First shingles shot today. Return in 2-6 months for nurse visit 2nd and final shingles shot  Good to see you today Return as needed or in 1 year for next physical.   Follow up plan: Return in about 1 year (around 01/10/2026) for annual exam, prior fasting for blood work.  Anton Blas, MD   "

## 2025-01-10 NOTE — Assessment & Plan Note (Signed)
 Encourage healthy diet and lifestyle choices to affect sustainable weight loss

## 2025-01-10 NOTE — Assessment & Plan Note (Signed)
Update A1c. Encouraged limiting added sugar in diet ?

## 2025-01-10 NOTE — Assessment & Plan Note (Signed)
 Chronic, stable on current regimen - continue.

## 2025-01-10 NOTE — Assessment & Plan Note (Addendum)
 Isolated episode without recurrence. Only on metoprolol  and aspirin .  Discussed OSA screen with HST - he will consider. ESS = 2 today

## 2025-01-10 NOTE — Assessment & Plan Note (Signed)
 Continue to encourage cessation. # provided to schedule lung cancer screening CT

## 2025-01-10 NOTE — Assessment & Plan Note (Addendum)
 Chronic, update fasting lipid panel off statin.  Reviewed prior ASCVD risk score - will updated cardiac CT with calcium score. The 10-year ASCVD risk score (Arnett DK, et al., 2019) is: 16.6%   Values used to calculate the score:     Age: 64 years     Clinically relevant sex: Male     Is Non-Hispanic African American: No     Diabetic: No     Tobacco smoker: Yes     Systolic Blood Pressure: 130 mmHg     Is BP treated: Yes     HDL Cholesterol: 58.8 mg/dL     Total Cholesterol: 192 mg/dL

## 2025-01-10 NOTE — Assessment & Plan Note (Signed)
 Preventative protocols reviewed and updated unless pt declined. Discussed healthy diet and lifestyle.

## 2025-01-10 NOTE — Patient Instructions (Signed)
 Labs today  I will order heart CT with calcium score - you may call [phone]   to set this up. This is $100.  Call to set up lung cancer screening CT at [phone].  Consider in the future future - screening or sleep apnea.  First shingles shot today. Return in 2-6 months for nurse visit 2nd and final shingles shot  Good to see you today Return as needed or in 1 year for next physical.

## 2025-01-10 NOTE — Assessment & Plan Note (Signed)
 Update LFTs, CBC today .
